# Patient Record
Sex: Male | Born: 1960
Health system: Southern US, Community
[De-identification: ages and names within clinical notes are randomized; demographics above are authoritative.]

## PROBLEM LIST (undated history)

## (undated) DIAGNOSIS — M545 Other chronic pain: Secondary | ICD-10-CM

## (undated) DIAGNOSIS — R0600 Dyspnea, unspecified: Secondary | ICD-10-CM

## (undated) DIAGNOSIS — M1990 Unspecified osteoarthritis, unspecified site: Secondary | ICD-10-CM

## (undated) DIAGNOSIS — R7611 Nonspecific reaction to tuberculin skin test without active tuberculosis: Secondary | ICD-10-CM

## (undated) DIAGNOSIS — I2510 Atherosclerotic heart disease of native coronary artery without angina pectoris: Secondary | ICD-10-CM

## (undated) DIAGNOSIS — I472 Ventricular tachycardia: Secondary | ICD-10-CM

## (undated) DIAGNOSIS — Z8679 Personal history of other diseases of the circulatory system: Secondary | ICD-10-CM

## (undated) DIAGNOSIS — E785 Hyperlipidemia, unspecified: Secondary | ICD-10-CM

## (undated) DIAGNOSIS — R9389 Abnormal findings on diagnostic imaging of other specified body structures: Secondary | ICD-10-CM

## (undated) DIAGNOSIS — J449 Chronic obstructive pulmonary disease, unspecified: Secondary | ICD-10-CM

## (undated) DIAGNOSIS — I00 Rheumatic fever without heart involvement: Secondary | ICD-10-CM

## (undated) DIAGNOSIS — Z860100 Personal history of colon polyps, unspecified: Secondary | ICD-10-CM

## (undated) DIAGNOSIS — F17200 Nicotine dependence, unspecified, uncomplicated: Secondary | ICD-10-CM

## (undated) DIAGNOSIS — I4729 Other ventricular tachycardia: Secondary | ICD-10-CM

## (undated) DIAGNOSIS — J301 Allergic rhinitis due to pollen: Secondary | ICD-10-CM

## (undated) DIAGNOSIS — I209 Angina pectoris, unspecified: Secondary | ICD-10-CM

## (undated) DIAGNOSIS — I639 Cerebral infarction, unspecified: Secondary | ICD-10-CM

## (undated) DIAGNOSIS — I10 Essential (primary) hypertension: Secondary | ICD-10-CM

## (undated) DIAGNOSIS — S31119A Laceration without foreign body of abdominal wall, unspecified quadrant without penetration into peritoneal cavity, initial encounter: Secondary | ICD-10-CM

## (undated) DIAGNOSIS — Z8719 Personal history of other diseases of the digestive system: Secondary | ICD-10-CM

## (undated) DIAGNOSIS — K219 Gastro-esophageal reflux disease without esophagitis: Secondary | ICD-10-CM

## (undated) DIAGNOSIS — R011 Cardiac murmur, unspecified: Secondary | ICD-10-CM

## (undated) DIAGNOSIS — K819 Cholecystitis, unspecified: Secondary | ICD-10-CM

## (undated) DIAGNOSIS — G8929 Other chronic pain: Secondary | ICD-10-CM

## (undated) HISTORY — DX: Personal history of colon polyps, unspecified: Z86.0100

## (undated) HISTORY — DX: Other chronic pain: M54.50

## (undated) HISTORY — DX: Chronic obstructive pulmonary disease, unspecified: J44.9

## (undated) HISTORY — DX: Allergic rhinitis due to pollen: J30.1

## (undated) HISTORY — DX: Essential (primary) hypertension: I10

## (undated) HISTORY — DX: Hyperlipidemia, unspecified: E78.5

## (undated) HISTORY — PX: FOOT SURGERY: SHX648

## (undated) HISTORY — DX: Laceration without foreign body of abdominal wall, unspecified quadrant without penetration into peritoneal cavity, initial encounter: S31.119A

## (undated) HISTORY — DX: Other chronic pain: G89.29

## (undated) HISTORY — DX: Rheumatic fever without heart involvement: I00

## (undated) HISTORY — DX: Other ventricular tachycardia: I47.29

## (undated) HISTORY — DX: Nonspecific reaction to tuberculin skin test without active tuberculosis: R76.11

## (undated) HISTORY — PX: VASECTOMY: SHX75

## (undated) HISTORY — DX: Ventricular tachycardia: I47.2

## (undated) HISTORY — DX: Cardiac murmur, unspecified: R01.1

## (undated) HISTORY — DX: Personal history of other diseases of the circulatory system: Z86.79

## (undated) HISTORY — DX: Personal history of colonic polyps: Z86.010

## (undated) HISTORY — DX: Dyspnea, unspecified: R06.00

## (undated) HISTORY — DX: Cerebral infarction, unspecified: I63.9

## (undated) HISTORY — DX: Low back pain: M54.5

## (undated) HISTORY — DX: Abnormal findings on diagnostic imaging of other specified body structures: R93.89

## (undated) HISTORY — PX: TONSILLECTOMY: SUR1361

---

## 2007-04-21 ENCOUNTER — Emergency Department (HOSPITAL_COMMUNITY): Admission: EM | Admit: 2007-04-21 | Discharge: 2007-04-21 | Payer: Self-pay | Admitting: Emergency Medicine

## 2008-05-22 ENCOUNTER — Emergency Department (HOSPITAL_COMMUNITY): Admission: EM | Admit: 2008-05-22 | Discharge: 2008-05-22 | Payer: Self-pay | Admitting: Emergency Medicine

## 2009-09-10 ENCOUNTER — Emergency Department (HOSPITAL_COMMUNITY): Admission: EM | Admit: 2009-09-10 | Discharge: 2009-09-10 | Payer: Self-pay | Admitting: Emergency Medicine

## 2009-10-12 ENCOUNTER — Ambulatory Visit: Payer: Self-pay | Admitting: Physician Assistant

## 2009-10-12 DIAGNOSIS — R0602 Shortness of breath: Secondary | ICD-10-CM

## 2009-10-12 DIAGNOSIS — K5900 Constipation, unspecified: Secondary | ICD-10-CM | POA: Insufficient documentation

## 2009-10-12 DIAGNOSIS — F17200 Nicotine dependence, unspecified, uncomplicated: Secondary | ICD-10-CM

## 2009-10-12 DIAGNOSIS — F1021 Alcohol dependence, in remission: Secondary | ICD-10-CM

## 2009-10-12 LAB — CONVERTED CEMR LAB
AST: 15 units/L (ref 0–37)
BUN: 15 mg/dL (ref 6–23)
Basophils Absolute: 0 10*3/uL (ref 0.0–0.1)
Basophils Relative: 0 % (ref 0–1)
Calcium: 9.7 mg/dL (ref 8.4–10.5)
Cocaine Metabolites: NEGATIVE
Creatinine,U: 101.5 mg/dL
Glucose, Bld: 98 mg/dL (ref 70–99)
HCT: 47.4 % (ref 39.0–52.0)
Hemoglobin: 15.7 g/dL (ref 13.0–17.0)
MCHC: 33.1 g/dL (ref 30.0–36.0)
Methadone: NEGATIVE
Monocytes Absolute: 0.5 10*3/uL (ref 0.1–1.0)
Monocytes Relative: 5 % (ref 3–12)
Neutro Abs: 6.2 10*3/uL (ref 1.7–7.7)
Opiate Screen, Urine: NEGATIVE
Phencyclidine (PCP): NEGATIVE
Platelets: 181 10*3/uL (ref 150–400)
Propoxyphene: NEGATIVE
RBC: 5.12 M/uL (ref 4.22–5.81)
TSH: 0.595 microintl units/mL (ref 0.350–4.500)
Total Bilirubin: 0.3 mg/dL (ref 0.3–1.2)
WBC: 10.4 10*3/uL (ref 4.0–10.5)

## 2009-10-13 ENCOUNTER — Encounter: Payer: Self-pay | Admitting: Physician Assistant

## 2009-10-15 ENCOUNTER — Telehealth: Payer: Self-pay | Admitting: Physician Assistant

## 2009-10-15 ENCOUNTER — Ambulatory Visit (HOSPITAL_COMMUNITY): Admission: RE | Admit: 2009-10-15 | Discharge: 2009-10-15 | Payer: Self-pay | Admitting: Internal Medicine

## 2009-10-20 ENCOUNTER — Ambulatory Visit: Payer: Self-pay | Admitting: Physician Assistant

## 2009-10-20 LAB — CONVERTED CEMR LAB
Cholesterol: 162 mg/dL (ref 0–200)
Triglycerides: 132 mg/dL (ref <150)

## 2009-10-21 ENCOUNTER — Ambulatory Visit (HOSPITAL_COMMUNITY): Admission: RE | Admit: 2009-10-21 | Discharge: 2009-10-21 | Payer: Self-pay | Admitting: Internal Medicine

## 2009-10-21 ENCOUNTER — Encounter: Payer: Self-pay | Admitting: Physician Assistant

## 2009-11-04 ENCOUNTER — Encounter: Payer: Self-pay | Admitting: Physician Assistant

## 2009-11-07 ENCOUNTER — Encounter: Payer: Self-pay | Admitting: Physician Assistant

## 2009-11-15 ENCOUNTER — Ambulatory Visit: Payer: Self-pay | Admitting: Physician Assistant

## 2009-11-15 DIAGNOSIS — J449 Chronic obstructive pulmonary disease, unspecified: Secondary | ICD-10-CM

## 2010-01-05 ENCOUNTER — Ambulatory Visit: Payer: Self-pay | Admitting: Physician Assistant

## 2010-01-05 DIAGNOSIS — J302 Other seasonal allergic rhinitis: Secondary | ICD-10-CM

## 2010-01-05 DIAGNOSIS — M79609 Pain in unspecified limb: Secondary | ICD-10-CM

## 2010-01-10 ENCOUNTER — Telehealth: Payer: Self-pay | Admitting: Physician Assistant

## 2010-01-11 ENCOUNTER — Encounter (INDEPENDENT_AMBULATORY_CARE_PROVIDER_SITE_OTHER): Payer: Self-pay | Admitting: Internal Medicine

## 2010-01-11 ENCOUNTER — Encounter: Payer: Self-pay | Admitting: Physician Assistant

## 2010-01-11 ENCOUNTER — Ambulatory Visit (HOSPITAL_COMMUNITY): Admission: RE | Admit: 2010-01-11 | Discharge: 2010-01-11 | Payer: Self-pay | Admitting: Internal Medicine

## 2010-01-12 ENCOUNTER — Encounter: Payer: Self-pay | Admitting: Physician Assistant

## 2010-01-16 ENCOUNTER — Encounter: Payer: Self-pay | Admitting: Physician Assistant

## 2010-01-31 ENCOUNTER — Encounter (HOSPITAL_COMMUNITY): Admission: RE | Admit: 2010-01-31 | Discharge: 2010-03-23 | Payer: Self-pay | Admitting: Cardiology

## 2010-02-02 ENCOUNTER — Emergency Department (HOSPITAL_COMMUNITY): Admission: EM | Admit: 2010-02-02 | Discharge: 2010-02-02 | Payer: Self-pay | Admitting: Emergency Medicine

## 2010-03-07 ENCOUNTER — Encounter: Payer: Self-pay | Admitting: Physician Assistant

## 2010-03-07 ENCOUNTER — Telehealth: Payer: Self-pay | Admitting: Physician Assistant

## 2010-03-07 ENCOUNTER — Ambulatory Visit: Payer: Self-pay | Admitting: Nurse Practitioner

## 2010-03-07 DIAGNOSIS — M545 Low back pain: Secondary | ICD-10-CM

## 2010-03-15 ENCOUNTER — Telehealth: Payer: Self-pay | Admitting: Physician Assistant

## 2010-03-16 ENCOUNTER — Encounter (INDEPENDENT_AMBULATORY_CARE_PROVIDER_SITE_OTHER): Payer: Self-pay | Admitting: Internal Medicine

## 2010-03-16 ENCOUNTER — Ambulatory Visit (HOSPITAL_COMMUNITY): Admission: RE | Admit: 2010-03-16 | Discharge: 2010-03-16 | Payer: Self-pay | Admitting: Internal Medicine

## 2010-03-16 ENCOUNTER — Encounter: Payer: Self-pay | Admitting: Physician Assistant

## 2010-03-16 ENCOUNTER — Ambulatory Visit: Payer: Self-pay | Admitting: Vascular Surgery

## 2010-03-24 ENCOUNTER — Emergency Department (HOSPITAL_COMMUNITY): Admission: EM | Admit: 2010-03-24 | Discharge: 2010-03-24 | Payer: Self-pay | Admitting: Emergency Medicine

## 2010-04-07 ENCOUNTER — Ambulatory Visit: Payer: Self-pay | Admitting: Physician Assistant

## 2010-05-02 ENCOUNTER — Encounter: Payer: Self-pay | Admitting: Physician Assistant

## 2010-05-04 ENCOUNTER — Telehealth: Payer: Self-pay | Admitting: Physician Assistant

## 2010-05-06 ENCOUNTER — Telehealth: Payer: Self-pay | Admitting: Physician Assistant

## 2010-05-11 ENCOUNTER — Telehealth: Payer: Self-pay | Admitting: Physician Assistant

## 2010-05-11 ENCOUNTER — Telehealth (INDEPENDENT_AMBULATORY_CARE_PROVIDER_SITE_OTHER): Payer: Self-pay | Admitting: *Deleted

## 2010-05-18 ENCOUNTER — Ambulatory Visit: Payer: Self-pay | Admitting: Internal Medicine

## 2010-05-18 DIAGNOSIS — J392 Other diseases of pharynx: Secondary | ICD-10-CM

## 2010-05-18 DIAGNOSIS — R930 Abnormal findings on diagnostic imaging of skull and head, not elsewhere classified: Secondary | ICD-10-CM

## 2010-05-20 ENCOUNTER — Ambulatory Visit: Payer: Self-pay | Admitting: Cardiovascular Disease

## 2010-05-23 ENCOUNTER — Encounter: Payer: Self-pay | Admitting: Internal Medicine

## 2010-05-23 ENCOUNTER — Telehealth (INDEPENDENT_AMBULATORY_CARE_PROVIDER_SITE_OTHER): Payer: Self-pay | Admitting: *Deleted

## 2010-05-26 ENCOUNTER — Encounter: Admission: RE | Admit: 2010-05-26 | Discharge: 2010-06-21 | Payer: Self-pay | Admitting: Physician Assistant

## 2010-05-26 ENCOUNTER — Emergency Department (HOSPITAL_COMMUNITY): Admission: EM | Admit: 2010-05-26 | Discharge: 2010-05-26 | Payer: Self-pay | Admitting: Family Medicine

## 2010-06-02 ENCOUNTER — Encounter: Payer: Self-pay | Admitting: Physician Assistant

## 2010-06-07 ENCOUNTER — Telehealth: Payer: Self-pay | Admitting: Physician Assistant

## 2010-06-07 ENCOUNTER — Ambulatory Visit: Payer: Self-pay | Admitting: Physician Assistant

## 2010-06-07 DIAGNOSIS — K219 Gastro-esophageal reflux disease without esophagitis: Secondary | ICD-10-CM

## 2010-06-07 DIAGNOSIS — F329 Major depressive disorder, single episode, unspecified: Secondary | ICD-10-CM

## 2010-06-07 DIAGNOSIS — L0291 Cutaneous abscess, unspecified: Secondary | ICD-10-CM

## 2010-06-07 DIAGNOSIS — N401 Enlarged prostate with lower urinary tract symptoms: Secondary | ICD-10-CM | POA: Insufficient documentation

## 2010-06-07 DIAGNOSIS — L0390 Cellulitis, unspecified: Secondary | ICD-10-CM

## 2010-06-07 LAB — CONVERTED CEMR LAB
Basophils Absolute: 0 10*3/uL (ref 0.0–0.1)
Hemoglobin: 15.3 g/dL (ref 13.0–17.0)
MCHC: 33.4 g/dL (ref 30.0–36.0)
MCV: 95 fL (ref 78.0–100.0)
Neutrophils Relative %: 57 % (ref 43–77)
Platelets: 163 10*3/uL (ref 150–400)
RBC: 4.82 M/uL (ref 4.22–5.81)
RDW: 15.4 % (ref 11.5–15.5)

## 2010-06-08 ENCOUNTER — Encounter: Payer: Self-pay | Admitting: Physician Assistant

## 2010-06-21 ENCOUNTER — Encounter: Payer: Self-pay | Admitting: Physician Assistant

## 2010-06-22 ENCOUNTER — Ambulatory Visit (HOSPITAL_COMMUNITY): Admission: RE | Admit: 2010-06-22 | Discharge: 2010-06-22 | Payer: Self-pay | Admitting: Internal Medicine

## 2010-06-22 ENCOUNTER — Encounter: Payer: Self-pay | Admitting: Internal Medicine

## 2010-06-26 ENCOUNTER — Encounter: Payer: Self-pay | Admitting: Physician Assistant

## 2010-06-28 ENCOUNTER — Ambulatory Visit: Payer: Self-pay | Admitting: Physician Assistant

## 2010-06-29 ENCOUNTER — Encounter: Payer: Self-pay | Admitting: Physician Assistant

## 2010-07-08 ENCOUNTER — Telehealth: Payer: Self-pay | Admitting: Internal Medicine

## 2010-07-11 ENCOUNTER — Encounter (INDEPENDENT_AMBULATORY_CARE_PROVIDER_SITE_OTHER): Payer: Self-pay | Admitting: Internal Medicine

## 2010-07-27 ENCOUNTER — Telehealth: Payer: Self-pay | Admitting: Physician Assistant

## 2010-08-03 ENCOUNTER — Ambulatory Visit (HOSPITAL_COMMUNITY): Admission: RE | Admit: 2010-08-03 | Discharge: 2010-08-03 | Payer: Self-pay | Admitting: Gastroenterology

## 2010-08-08 ENCOUNTER — Telehealth: Payer: Self-pay | Admitting: Internal Medicine

## 2010-08-08 ENCOUNTER — Ambulatory Visit: Payer: Self-pay | Admitting: Physician Assistant

## 2010-08-08 DIAGNOSIS — R269 Unspecified abnormalities of gait and mobility: Secondary | ICD-10-CM

## 2010-08-08 DIAGNOSIS — Z860100 Personal history of colon polyps, unspecified: Secondary | ICD-10-CM | POA: Insufficient documentation

## 2010-08-12 ENCOUNTER — Encounter (INDEPENDENT_AMBULATORY_CARE_PROVIDER_SITE_OTHER): Payer: Self-pay | Admitting: Internal Medicine

## 2010-08-12 ENCOUNTER — Ambulatory Visit: Payer: Self-pay | Admitting: Vascular Surgery

## 2010-08-12 ENCOUNTER — Ambulatory Visit (HOSPITAL_COMMUNITY): Admission: RE | Admit: 2010-08-12 | Discharge: 2010-08-12 | Payer: Self-pay | Admitting: Internal Medicine

## 2010-08-12 ENCOUNTER — Telehealth (INDEPENDENT_AMBULATORY_CARE_PROVIDER_SITE_OTHER): Payer: Self-pay | Admitting: *Deleted

## 2010-08-15 ENCOUNTER — Encounter: Payer: Self-pay | Admitting: Physician Assistant

## 2010-08-16 ENCOUNTER — Ambulatory Visit (HOSPITAL_COMMUNITY): Admission: RE | Admit: 2010-08-16 | Discharge: 2010-08-16 | Payer: Self-pay | Admitting: Internal Medicine

## 2010-08-16 ENCOUNTER — Encounter: Payer: Self-pay | Admitting: Internal Medicine

## 2010-08-17 ENCOUNTER — Telehealth: Payer: Self-pay | Admitting: Physician Assistant

## 2010-08-19 ENCOUNTER — Encounter: Payer: Self-pay | Admitting: Physician Assistant

## 2010-08-19 ENCOUNTER — Emergency Department (HOSPITAL_COMMUNITY): Admission: EM | Admit: 2010-08-19 | Discharge: 2010-08-19 | Payer: Self-pay | Admitting: Emergency Medicine

## 2010-08-19 DIAGNOSIS — K5730 Diverticulosis of large intestine without perforation or abscess without bleeding: Secondary | ICD-10-CM | POA: Insufficient documentation

## 2010-08-19 DIAGNOSIS — A048 Other specified bacterial intestinal infections: Secondary | ICD-10-CM | POA: Insufficient documentation

## 2010-08-21 ENCOUNTER — Ambulatory Visit: Payer: Self-pay | Admitting: Internal Medicine

## 2010-08-23 ENCOUNTER — Ambulatory Visit: Payer: Self-pay | Admitting: Internal Medicine

## 2010-09-01 ENCOUNTER — Telehealth: Payer: Self-pay | Admitting: Physician Assistant

## 2010-09-19 ENCOUNTER — Ambulatory Visit: Payer: Self-pay | Admitting: Physician Assistant

## 2010-11-18 ENCOUNTER — Encounter: Payer: Self-pay | Admitting: Physician Assistant

## 2010-11-21 ENCOUNTER — Telehealth (INDEPENDENT_AMBULATORY_CARE_PROVIDER_SITE_OTHER): Payer: Self-pay | Admitting: *Deleted

## 2011-01-11 DIAGNOSIS — I2510 Atherosclerotic heart disease of native coronary artery without angina pectoris: Secondary | ICD-10-CM

## 2011-01-11 HISTORY — DX: Atherosclerotic heart disease of native coronary artery without angina pectoris: I25.10

## 2011-01-11 HISTORY — PX: PERCUTANEOUS CORONARY STENT INTERVENTION (PCI-S): SHX6016

## 2011-01-14 IMAGING — CT CT CHEST W/O CM
1 of 5 series · 4 of 36 positions shown, 5 images · non-contrast
Comparison: Chest x-ray 03/24/2010

CLINICAL DATA: Shortness of breath.  Abnormal chest x-ray.

CT CHEST WITHOUT CONTRAST
TECHNIQUE: Multidetector CT imaging of the chest was performed
following the standard protocol without IV contrast.

[Series 602: <mpr thick range> · coronal · 0.65mm/px · 4 of 146 slices shown, 5 images]
[im 30/146  mediastinal]
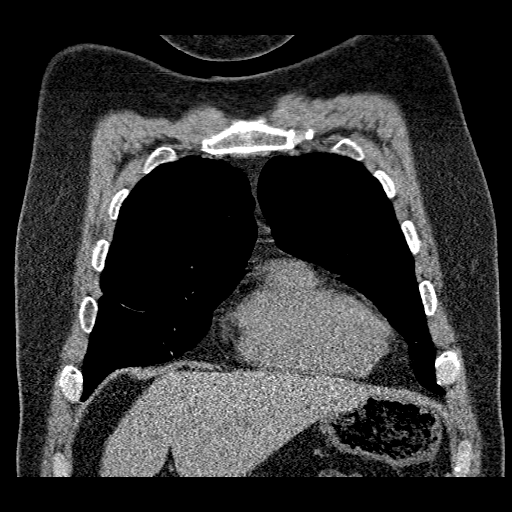
[im 30/146  lung]
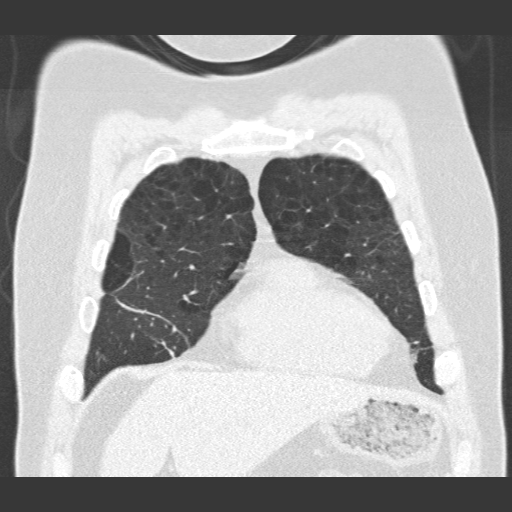
[im 59/146  lung]
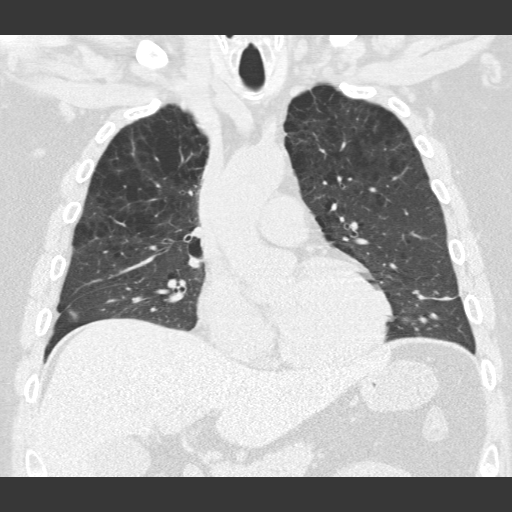
[im 88/146  lung]
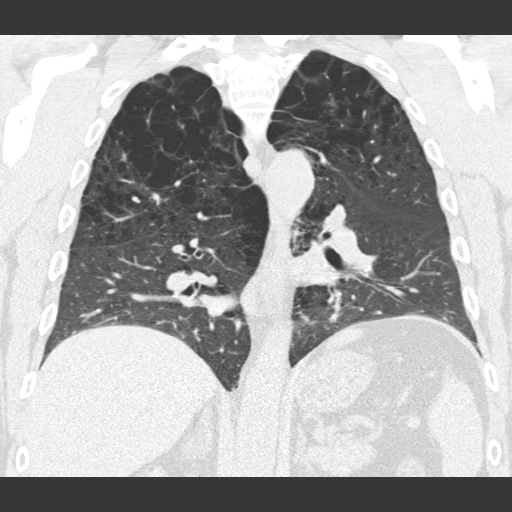
[im 117/146  lung]
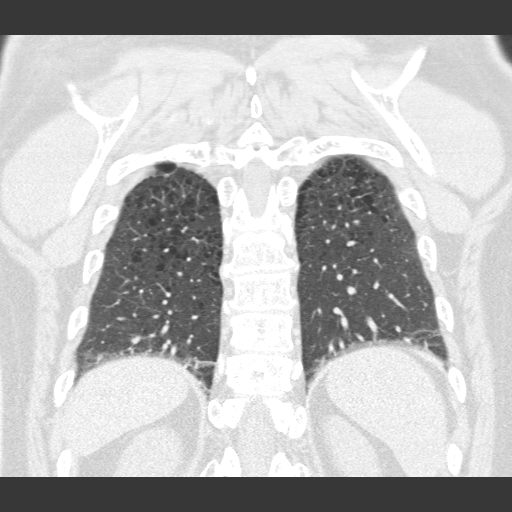

[4 of 36 positions shown; findings below may reference images not displayed]

FINDINGS: Mediastinal lymph nodes are not enlarged by CT size
criteria.  Hilar regions are difficult to definitively evaluate
without IV contrast.  No axillary adenopathy.  A focal
calcification is seen at the bifurcation of the left anterior
descending and left circumflex coronary arteries.  Heart size
normal.  No pericardial effusion.  There are small lymph nodes
along the course of the esophagus.

Centrilobular emphysema, with bullous changes at the apices.
Scarring is seen in the lung bases.  No pleural fluid.  Airway is
unremarkable. High resolution imaging shows no evidence of
superimposed interstitial lung disease or pulmonary fibrosis.
Inspiratory and expiratory imaging shows no air trapping.

Incidental imaging of the upper abdomen shows no acute findings.
No worrisome lytic or sclerotic lesions.
IMPRESSION: 1.  Emphysema with scattered bibasilar scarring. No evidence of
superimposed interstitial lung disease or pulmonary fibrosis.
2.  Focal atherosclerotic calcification near the bifurcation of the
left anterior descending and left circumflex coronary arteries.

## 2011-01-17 NOTE — Letter (Signed)
Summary: REFERRAL/PULMONARY//APPT DATE & TIME  REFERRAL/PULMONARY//APPT DATE & TIME   Imported By: Arta Bruce 06/27/2010 10:11:40  _____________________________________________________________________  External Attachment:    Type:   Image     Comment:   External Document

## 2011-01-17 NOTE — Progress Notes (Signed)
  Phone Note From Other Clinic   Summary of Call: pt has referral for GI and PT... pt just got card renewed recently :) Initial call taken by: Armenia Shannon,  May 11, 2010 10:57 AM
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
## 2011-01-17 NOTE — Progress Notes (Signed)
Summary: cpst need to redo  Phone Note Outgoing Call   Summary of Call: pls cal patient. THE CPST test did not coome out well. He did not work hard enough. So we might have to repeat it. Please tell him to work harder. I am in process of trying to get this redone. No extra cost Initial call taken by: Kalman Shan MD,  July 08, 2010 6:55 PM  Follow-up for Phone Call        Called and informed the patient about the cpst. Pt states he worked as hard as he could, so he doesn;t understand how he didn't work hard enough. Pt states he is really sore still from doing the test. Pt states he is okay with re doing the test if need be. Pt would also like to know if MR still wants hime ti come in on july 27 for his apt he has scheduled with him? Carver Fila  July 11, 2010 1:15 PM   Additional Follow-up for Phone Call Additional follow up Details #1::        called an informed pt per Latessa Tillis no need to come in for scheduled apt for july 27,2011. pt states he verbally understood. Pt would like to know if he can start taking advair diskus bc pt states he is haivg acid reflux Carver Fila  July 11, 2010 4:09 PM   spoke with MR and told him the pt called back since he had not heard from him--MR states he will call pt this afternoon to discuss msg Additional Follow-up by: Philipp Deputy CMA,  July 21, 2010 1:32 PM    Additional Follow-up for Phone Call Additional follow up Details #2::    spoke to patient. Depending on ct result I wanted to start spiriva. His CT shows emphysema. So he can come and get samples (give 2-3) of SPIRIVA one puff daily Told him not to take advair.  Also, c/o "seizure like activity" with cough.   Also, reschedule CPST with abg pre and abg at peak exercise. Test at no extra cost due to poor test performance prior to test.   REturn to office after repeat CPST  If cough is bothersome go to ER  Follow-up by: Kalman Shan MD,  July 21, 2010 4:22 PM  Additional Follow-up  for Phone Call Additional follow up Details #3:: Details for Additional Follow-up Action Taken: Spoke with pt and notified [facility] will cntact to resched CPST at no cost.  Spiriva samples left up front for pt.  Pt verbalized understanding. Additional Follow-up by: Vernie Murders,  July 21, 2010 4:40 PM

## 2011-01-17 NOTE — Letter (Signed)
Summary: CPST [facility] Pul[facility]  [address]   [place], [place] [address]   Phone: [phone]  Fax: [phone]     Patient's Name: John Forbes Date of Birth: 04/14/1961 MRN: [id number]  CPST  Choose test method and choice  a)__x_Bike - recommended by ATS/ACCP. Do at [facility] at [facility]  b)___Treadmill - less preferred. Do at [facility] at [facility] or do at [facility] P[facility] lab  Choose one or more indication for test  INDICATIONS FOR CARDIOPULMONARY EXERCISE TESTING Evaluation of exercise tolerance ______ Determination of functional impairment or capacity (peak V? O2) ______ Determination of exercise-limiting factors and pathophysiologic mechanisms  Evaluation of undiagnosed exercise intolerance __x___ Assessing contribution of cardiac and pulmonary etiology in coexisting disease __x___ Symptoms disproportionate to resting pulmonary and cardiac tests  __x___Unexplained dyspnea when initial cardiopulmonary testing is nondiagnostic  Evaluation of patients with cardiovascular disease _____ Functional evaluation and prognosis in patients with heart failure _____ Selection for cardiac transplantation _____ Exercise prescription and monitoring response to exercise training for cardiac rehabilitation (special circumstances; i.e., pacemakers)  Evaluation of patients with respiratory disease _____ Functional impairment assessment (see specific clinical applications)  _x____Chronic obstructive pulmonary disease Establishing exercise limitation(s) and assessing other potential contributing factors, especially occult heart disease (ischemia) ______Determination of magnitude of hypoxemia and for O2 prescription When objective determination of therapeutic intervention is necessary and not adequately addressed by standard pulmonary function testing  _____ Interstitial lung diseases _____Detection of early (occult) gas exchange abnormalities _____Overall  assessment/monitoring of pulmonary gas exchange _____Determination of magnitude of hypoxemia and for O2 prescription _____Determination of potential exercise-limiting factors _____Documentation of therapeutic response to potentially toxic therapy  ____ Pulmonary vascular disease (careful risk-benefit analysis required)  ____ Cystic fibrosis  ____ Exercise-induced bronchospasm  Specific clinical applications ____  Preoperative evaluation _____Lung resectional surgery _____Elderly patients undergoing major abdominal surgery _____Lung volume resectional surgery for emphysema (currently investigational)  ____ Exercise evaluation and prescription for pulmonary rehabilitation  _x___ Evaluation for impairment-disability  ____ Evaluation for lung, heart-lung transplantation ____ Definition of abbreviation: V? O2______ -oxygen consumption.    Kalman Shan MD    [facility]

## 2011-01-17 NOTE — Miscellaneous (Signed)
Summary: Rehab Report/DISCHARGE SUMMARY  Rehab Report/DISCHARGE SUMMARY   Imported By: Arta Bruce 06/29/2010 11:50:11  _____________________________________________________________________  External Attachment:    Type:   Image     Comment:   External Document

## 2011-01-17 NOTE — Progress Notes (Signed)
Summary: PT postponed by patient due to transportation issues.  Phone Note Outgoing Call   Summary of Call: F.Y.I  CRYSTAL FROM OT TOLD ME THAT MR Lutterman  HAVEN'T ISSUES WITH GAS MONEY HE WANT TO PUT IT IN HOLD HIS APPT  FOR NEXT MONTH AND HE IS GOING TO APPLY FOR MEDICARE . Initial call taken by: Cheryll Dessert,  September 01, 2010 9:35 AM
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
## 2011-01-17 NOTE — Progress Notes (Signed)
Summary: GI referrral update  Phone Note From Other Clinic   Caller: Referral Coordinator Summary of Call: Pt is sched for Colon & Endo with Dr.[place] on 08-03-10 Initial call taken by: Candi Leash,  July 27, 2010 8:22 AM

## 2011-01-17 NOTE — Letter (Signed)
Summary: *HSN Results Follow up  [facility]  [address] [place], [place] [address]   Phone: [phone]  Fax: [phone]      06/08/2010   [name], [name]   Dear  Mr. John Forbes,                            ____S.Drinkard,FNP   ____D. Gore,FNP       ____B. McPherson,MD   ____V. Rankins,MD    ____E. Mulberry,MD    ____N. Daphine Deutscher, FNP  ____D. Reche Dixon, MD    ____K. Philipp Deputy, MD    __x__S. Alben Spittle, PA-C     This letter is to inform you that your recent test(s):  _______Pap Smear    ___x____Lab Test     _______X-ray    ___x____ is within acceptable limits  _______ requires a medication change  _______ requires a follow-up lab visit  _______ requires a follow-up visit with your provider   Comments:  Blood counts normal.  PSA (prostate test) normal.  Vitamin D level is normal.       _________________________________________________________ If you have any questions, please contact our office                     Sincerely,  John Newcomer PA-C [facility]

## 2011-01-17 NOTE — Letter (Signed)
Summary: CPST- R/O Contraindications  [facility]  [address]   [place], [place] [address]   Phone: [phone]  Fax: [phone]    Patient's Name: John Forbes Date of Birth: 01-09-1961 MRN: [id number]  *********Rule out Contraindications**************** Absolute                                                                                                                           ___ Acute MI (3-5 Days)                                 ___ Unstable Angina                                          ___ Uncontrolled arrhythmias causing symptoms or hemodynamic compromise. ___ Syncope                                                     ___ Active endocarditis                                         ___ Acute Myocarditis/Pericarditis                        ___ Symptomatic severe aortic stenosis  ___ Acute Pulmonary embolus or pulmonary infarction                ___ Uncontrolled Heart Failure  ___ Thrombosis of lower extremitie ___ Suspected dissecting aneurysm ___ Uncontrolled Asthma                          ___ Pulmonary Edema                                        ___ RA desat @ rest<85%                                      ___ Repiratory Failure                                         ___ Acute noncardiopulmonary disorder that may affect exercise performance or be         aggravated by exercise (ie infection, renal failure,  thyrotoxicosis) .                               ___ Mental impairment leading to inabliity to cooperate   Relative ___ Left main coronary stenosis or its equivalent ___ Moderate stentoic valvular heart disease ___ Severe untreated arterial hypertension @ rest (<200 mmHg             systolic,>119mmHg Diastolic ___ Tachy/Brady Arrhythmias ___ High- degree artioventricular block ___ Hypertrophic cardiomyopathy ___ Significant pulmonary hypertension ___ Advanced or complicated pregnancy ___ Electrolyte abnormalities ___ Orthopedic impairment  that compromises exercise performance  NO CONTRAINDICATION. LEXISCANMYOVIEW negative but does have coronary calcification. No chestpain  Kalman Shan MD    [facility]

## 2011-01-17 NOTE — Letter (Signed)
Summary: REQUESTING RECORS FROM [facility]  REQUESTING RECORS FROM [facility]   Imported By: Arta Bruce 12/31/2009 11:31:37  _____________________________________________________________________  External Attachment:    Type:   Image     Comment:   External Document

## 2011-01-17 NOTE — Progress Notes (Signed)
Summary: ct results  Phone Note Call from Patient   Caller: Patient Call For: John Forbes Reason for Call: Lab or Test Results Summary of Call: Wants results of ct. Initial call taken by: Darletta Moll,  May 23, 2010 10:37 AM  Follow-up for Phone Call        Results unsigned in EMR-will forward message to MR.  Pls advise.  Thanks! Gweneth Dimitri RN  May 23, 2010 11:01 AM   Additional Follow-up for Phone Call Additional follow up Details #1::        see results note. he neeeds cpst. I have ordered it. Please give fu appt after CPST is done Additional Follow-up by: Kalman Shan MD,  May 23, 2010 5:52 PM    Additional Follow-up for Phone Call Additional follow up Details #2::    Spoke with pt and advised of CT results per append and the need for CPST. Pt states he has not taken adviar sine appt on 05-18-10, bu the still has that feeling in his throat like someone is strangling him and wants to know what MR recs for this. Please advise. Carron Curie CMA  May 24, 2010 9:50 AM   Additional Follow-up for Phone Call Additional follow up Details #3:: Details for Additional Follow-up Action Taken: He had PFTs in Nov 2010. Can you get hold of that actual trace  please. He should still get his CPST and then come to see me. Then, we will decide. IF it gets worse in interim, he can call back and we can get him to see ENT  1st available CPST is June 23, 2010 at 11:30 at [facility]. Pt is aware of appt. and f/u appt scheduled 07/13/10 at 1:30 to see Dr. Marchelle Gearing. Rhonda Cobb  May 24, 2010 10:21 AM per Bjorn Loser pt is aware of both appts will await call from pt if he has further trouble if doing ok will just keep reg. f/u   Philipp Deputy [facility]  May 24, 2010 1:36 PM  Additional Follow-up by: Kalman Shan MD,  May 24, 2010 9:58 AM
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
## 2011-01-17 NOTE — Letter (Signed)
Summary: TEST ORDER FORM/2 D ECHO//APPT DATE & TIME  TEST ORDER FORM/2 D ECHO//APPT DATE & TIME   Imported By: Arta Bruce 02/15/2010 12:46:59  _____________________________________________________________________  External Attachment:    Type:   Image     Comment:   External Document
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
## 2011-01-17 NOTE — Progress Notes (Signed)
Summary: GI referral  Phone Note Outgoing Call   Summary of Call: Please refer to Dr. Doreatha Martin for [facility] +/- EGD (patient with reflux symptoms).  Has Fhx of colon cancer and needs screening colo. Initial call taken by: Brynda Rim,  June 07, 2010 5:42 PM

## 2011-01-17 NOTE — Progress Notes (Signed)
Summary: GI and PT referral  Phone Note Call from Patient Call back at [facility] Phone [phone] Call back at [phone]   Summary of Call: The pt is requesting to be referral to do a colonoscopy and physical therapy.  Also, the pt wants the provider write down a letter to his attorney Su Hilt)  that he is unable to work. Kamirah Shugrue PA-c Initial call taken by: Manon Hilding,  May 04, 2010 9:24 AM  Follow-up for Phone Call        He had appt for colonoscopy and has had referral put in for PT.  [facility], I think he may have been waiting on eligibility. Please refer to GI for screening colo and PT for back pain.  [place], His attorney will need to send me a request for what he needs.  Follow-up by: Tereso Newcomer PA-C,  May 06, 2010 1:39 PM     Appended Document: GI and PT referral [place], Please see phone note regarding the letter he needs. Tereso Newcomer PA-C  May 09, 2010 1:10 PM  which phone note.. [place] Shannon  May 10, 2010 4:22 PM  Phone note from 05/04/2010 Needs a letter to his attorney. Tereso Newcomer PA-C  May 10, 2010 4:49 PM   Left message on answering machine for pt to call back...Marland KitchenMarland KitchenMarland Kitchen  [place] Shannon  May 10, 2010 4:59 PM  pt says the letter is for his  disability lawyer... pt says it needs to state he can't work in his condition that he is in... [place] Shannon  May 11, 2010 10:55 AM   pt called to see if you have wrote the letter to his lawyer...Marland Kitchen. called lawyer office and they said they need a letter of support and they wants to know the limitations of the pt and if he can/ can not work.Huntley Dec from lawyers office says this letter is to see if they will take his case or not.. we can fax the letter to  [phone] [place]  [place], Letter is in his chart. Please send to patient or his lawyer. If my electronic signature does not print, let me sign it. Tereso Newcomer PA-C  June 26, 2010 10:45 PM  letter sent.Marland KitchenMarland KitchenArm[place] Shannon  June 29, 2010 3:40 PM    Phone Note Call from  Patient

## 2011-01-17 NOTE — Miscellaneous (Signed)
Summary: Rehab Report//INITIAL SUMMARY  Rehab Report//INITIAL SUMMARY   Imported By: Arta Bruce 06/02/2010 09:51:21  _____________________________________________________________________  External Attachment:    Type:   Image     Comment:   External Document

## 2011-01-17 NOTE — Progress Notes (Signed)
  Phone Note Other Incoming   Request: Send information Summary of Call: Received attorneys request from [employer], forwarded request to [employer].
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
## 2011-01-17 NOTE — Progress Notes (Signed)
Summary: cpst repeat will be charged  Phone Note Outgoing Call   Summary of Call: called patient - told him reopeat CPST will be charged. This is contradictory to what I ahd told him in past. However, I wil only charge once my part of interpretation. He is okay with it. Says he is on 'orange card' program. So, he will have test 8/30 and see me 9/6 Initial call taken by: Kalman Shan MD,  August 08, 2010 4:25 PM

## 2011-01-17 NOTE — Letter (Signed)
Summary: REQUESTING RECORDS FROM [facility]  REQUESTING RECORDS FROM [facility]   Imported By: Arta Bruce 12/30/2009 15:54:51  _____________________________________________________________________  External Attachment:    Type:   Image     Comment:   External Document

## 2011-01-17 NOTE — Letter (Signed)
Summary: LOWER EXTREMITY ARTERIAL EVAL//FINAL REPORT  LOWER EXTREMITY ARTERIAL EVAL//FINAL REPORT   Imported By: Arta Bruce 03/17/2010 10:10:06  _____________________________________________________________________  External Attachment:    Type:   Image     Comment:   External Document

## 2011-01-17 NOTE — Letter (Signed)
Summary: REQUESTED RECORDS FROM [facility]//SHREDDED AFTER 10 YRS.  REQUESTED RECORDS FROM [facility]//SHREDDED AFTER 10 YRS.   Imported By: Arta Bruce 03/09/2010 15:35:32  _____________________________________________________________________  External Attachment:    Type:   Image     Comment:   External Document

## 2011-01-17 NOTE — Letter (Signed)
Summary: REFERRAL/GASTROENTEROLOGY  REFERRAL/GASTROENTEROLOGY   Imported By: Arta Bruce 11/18/2010 12:58:23  _____________________________________________________________________  External Attachment:    Type:   Image     Comment:   External Document
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
## 2011-01-17 NOTE — Miscellaneous (Signed)
Summary: Records from [place]  No records rec'd from previous dr. in [place]. Records shredded after 10 years.   Clinical Lists Changes

## 2011-01-17 NOTE — Letter (Signed)
Summary: LAB ORDER   LAB ORDER   Imported By: Arta Bruce 10/14/2010 11:29:50  _____________________________________________________________________  External Attachment:    Type:   Image     Comment:   External Document

## 2011-01-17 NOTE — Miscellaneous (Signed)
  Clinical Lists Changes  Problems: Added new problem of DIVERTICULOSIS, COLON (ICD-562.10) - seen on colonoscopy 07/2010 Observations: Added new observation of PRIMARY MD: Tereso Newcomer PA-C (08/19/2010 13:34)

## 2011-01-17 NOTE — Progress Notes (Signed)
Summary: GI & PT referral updates  Phone Note From Other Clinic   Caller: Referral Coordinator Summary of Call: Pt had an appt @ [facility] on 03-23-10 with Dr.Hayes.Pt was aware of this.  [facility] called to offer the Pt an appt and Pt informed them that his card was expired. I called Pt today and gave him [facility] # to R/S and also informed him that he wil be charged a [employer]. I updated th PT referral & gave Pt the # to get an appt since they already had the 1st referral Initial call taken by: Candi Leash,  May 11, 2010 11:49 AM  Follow-up for Phone Call        [place], Please follow up with patient to see if he has made his appointments yet. Follow-up by: Tereso Newcomer PA-C,  May 22, 2010 1:45 PM  Additional Follow-up for Phone Call Additional follow up Details #1::        spoke with pt and he said PT called and has appt with them and has not heard from GI yet Additional Follow-up by: [place] Shannon,  May 23, 2010 9:44 AM

## 2011-01-17 NOTE — Letter (Signed)
Summary: RECEIVED HEALTH Dentist [facility]  RECEIVED HEALTH INSURANCE CLAIM [facility]   Imported By: Arta Bruce 03/15/2010 12:41:45  _____________________________________________________________________  External Attachment:    Type:   Image     Comment:   External Document

## 2011-01-17 NOTE — Letter (Signed)
Summary: REQUESTING RECORDS FROM [facility]  REQUESTING RECORDS FROM [place]   Imported By: Arta Bruce 12/31/2009 11:33:52  _____________________________________________________________________  External Attachment:    Type:   Image     Comment:   External Document

## 2011-01-17 NOTE — Letter (Signed)
Summary: Handout Printed  Printed Handout:  - Leg Cramps

## 2011-01-17 NOTE — Letter (Signed)
Summary: DIAGNOSIS:DYSPNEA  DIAGNOSIS:DYSPNEA   Imported By: Arta Bruce 02/01/2010 14:51:34  _____________________________________________________________________  External Attachment:    Type:   Image     Comment:   External Document

## 2011-01-17 NOTE — Progress Notes (Signed)
  Phone Note Outgoing Call   Summary of Call: Stanton Kidney, Patient needs referral to PT for low back pain.  Order in system.  Also, needs GI referral for screening colo.  Has a FHx of colon cancer and never had test done.  Order in system. Initial call taken by: Brynda Rim,  March 07, 2010 3:19 PM

## 2011-01-17 NOTE — Progress Notes (Signed)
Summary: WANTS COLONOSCOPY RESULTS  Phone Note Call from Patient Call back at Home Phone [phone]   Reason for Call: Lab or Test Results Summary of Call: John Forbes pt. mr John Forbes called to see if someone from her will call him and let him know what his results from his colonoscopy. Initial call taken by: Leodis Rains,  August 17, 2010 9:50 AM  Follow-up for Phone Call        He had a colon polyp.  He knew that.  I cannot find any pathology results.  Call [facility] to request path results. Tereso Newcomer PA-C  August 17, 2010 9:59 PM  Per medical records -- states they don't have a report yet -- listed as pending.   Dutch Quint RN  August 18, 2010 10:56 AM   Additional Follow-up for Phone Call Additional follow up Details #1::        Please notify patient and have copy of report sent to me when available. Tereso Newcomer PA-C  August 18, 2010 2:07 PM  I called [facility] and they said he hadn't had it done.  However, according to the operative report dated 08/03/10,  he had one done with his endoscopy.  Please see attachment of that date.  What can I tell the patient? Additional Follow-up by: Dutch Quint RN,  August 26, 2010 11:56 AM    Additional Follow-up for Phone Call Additional follow up Details #2::    Advised pt. of pending results.  Dutch Quint RN  August 23, 2010 9:22 AM  Spoke with [facility] who called [facility] [facility].  They do not have a specimen. So the polyp was removed.  But I misunderstood and thought a biopsy was pending.  It is not.   Dr. Blossom Hoops note indicates that he wants the patient to have a repeat in one year.   I suppose he did not think it needed biopsy.  I suggest we follow through with his plan to get another colo in one year to follow up. Tereso Newcomer PA-C  August 26, 2010 1:28 PM  Advised pt. of provider's response and recommendation for follow-up colonscopy in one year -- verbalized agreement.  Dutch Quint RN  August 29, 2010 10:30  AM
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
## 2011-01-17 NOTE — Progress Notes (Signed)
  Phone Note Outgoing Call   Summary of Call: Patient referred to card.   If they decide to do a stress test, please ask cardiologist to perform O2 sat check during ambulation with stress test and in recovery (tell them patient has significant COPD). Thanks Initial call taken by: Brynda Rim,  January 10, 2010 10:28 AM  Follow-up for Phone Call        Left message on answering machine for Dr. Norris Cross nurse  to call back.Marland KitchenMarland KitchenMarland KitchenArmenia Shannon  January 10, 2010 10:36 AM   Left message on answering machine for pt to call back.Marland KitchenMarland KitchenMarland KitchenArmenia Shannon  January 13, 2010 12:43 PM

## 2011-01-17 NOTE — Letter (Signed)
Summary: MAILED REQUESTED RECORDS TO [employer]  MAILED REQUESTED RECORDS TO [employer]   Imported By: Arta Bruce 11/18/2010 15:24:35  _____________________________________________________________________  External Attachment:    Type:   Image     Comment:   External Document
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
## 2011-01-17 NOTE — Progress Notes (Signed)
Summary: no show for vascular studies  Phone Note Outgoing Call   Summary of Call: Vascular lab called pt was a no show for his LE Arterial  doppler studies. Pt. called states his truck broke and had no way to get there. stressed importance of getting test done ASAP. His orange card expires 03/21/10. Encouraged to call and schedule appt. for Vascular lab and eligibility ASAP. He does not want to go for PT until after back x-rays. will forward to [facility] Initial call taken by: Gaylyn Cheers RN,  March 15, 2010 12:52 PM  Follow-up for Phone Call        Get the xray done. He can still go to PT. He needs to get ABIs done as well. Follow-up by: Tereso Newcomer PA-C,  March 15, 2010 4:48 PM  Additional Follow-up for Phone Call Additional follow up Details #1::        pt states he is not going to do PT until he has xrays of his neck completed. He is headed to [facility] to vascular lab this morning and is going to get xrays as well per pt. Did advise I would let Lorin Picket know what his plans are and when those results come in we will contact as needed. Additional Follow-up by: Mikey College CMA,  March 16, 2010 9:49 AM    Additional Follow-up for Phone Call Additional follow up Details #2::    ok Follow-up by: Tereso Newcomer PA-C,  March 16, 2010 11:07 AM

## 2011-01-17 NOTE — Progress Notes (Signed)
  Phone Note Outgoing Call   Summary of Call: Confirm that patient knows that he sees the pulmonologist on 6.1.2011 at 1:30 (arrive at 1:15).  Initial call taken by: Brynda Rim,  May 06, 2010 4:34 PM  Follow-up for Phone Call        pt is aware of appt...  Follow-up by: Arm[place] Shannon,  May 11, 2010 10:52 AM

## 2011-01-17 NOTE — Letter (Signed)
Summary: Generic Letter  [facility]  [address] [place], [place] [address]   Phone: [phone]  Fax: [phone]    06/26/2010  To whom it may concern:  John Forbes (DOB 1961-07-22)has been a patient of mine since 09/2009.  He came to me with a history of breathing problems and reports having been exposed to ammonia gas in [place] in the mid 1990s.  I have no prior records available for my review.  He has undergone an initial evaluation by me.  It seems that he likely has COPD/emphysema related to a long history of ongoing cigarette smoking in addition to possible exposure to ammonia gas.  He has been referred to a pulmonologist, who is currently performing a series of tests on him.  The patient tells me he is applying for disability.  He reports significant shortness of breath that interferes with his ability to find meaningful employment.  I suggest he get a statement from his pulmonologist once his evaluation is complete.  If you have any further questions, please do not hesitate to contact me.      Sincerely,   Tereso Newcomer PA-C
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
## 2011-01-24 ENCOUNTER — Telehealth (INDEPENDENT_AMBULATORY_CARE_PROVIDER_SITE_OTHER): Payer: Self-pay | Admitting: *Deleted

## 2011-02-02 NOTE — Progress Notes (Signed)
Summary: sample of sprivia  Phone Note Call from Patient   Caller: Patient Call For: ramaswamy Summary of Call: patient is here now and having difficulty with his insurance and he wants to know if he can get a sample of Sprivia. He is making an appointment and than I will have him wait in the lobby Initial call taken by: Vedia Coffer,  January 24, 2011 1:22 PM  Follow-up for Phone Call        Sample if Spiriva given to pt.  Pt scheduled f/u appt with Dr Marchelle Gearing. Abigail Miyamoto RN  January 24, 2011 1:29 PM

## 2011-02-20 ENCOUNTER — Ambulatory Visit (INDEPENDENT_AMBULATORY_CARE_PROVIDER_SITE_OTHER): Payer: Self-pay | Admitting: Internal Medicine

## 2011-02-20 ENCOUNTER — Encounter: Payer: Self-pay | Admitting: Internal Medicine

## 2011-02-20 DIAGNOSIS — F17200 Nicotine dependence, unspecified, uncomplicated: Secondary | ICD-10-CM

## 2011-02-20 DIAGNOSIS — J449 Chronic obstructive pulmonary disease, unspecified: Secondary | ICD-10-CM

## 2011-02-20 DIAGNOSIS — M79609 Pain in unspecified limb: Secondary | ICD-10-CM
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
## 2011-03-03 LAB — BLOOD GAS, ARTERIAL
Acid-base deficit: 2 mmol/L (ref 0.0–2.0)
Bicarbonate: 22 mEq/L (ref 20.0–24.0)
Drawn by: 257081
O2 Saturation: 95 %
Patient temperature: 98.6
TCO2: 16.1 mmol/L (ref 0–100)
pCO2 arterial: 28.6 mmHg — ABNORMAL LOW (ref 35.0–45.0)
pH, Arterial: 7.346 — ABNORMAL LOW (ref 7.350–7.450)
pH, Arterial: 7.406 (ref 7.350–7.450)
pO2, Arterial: 96.8 mmHg (ref 80.0–100.0)

## 2011-03-10 ENCOUNTER — Encounter: Payer: Self-pay | Admitting: Nurse Practitioner

## 2011-03-10 ENCOUNTER — Encounter (INDEPENDENT_AMBULATORY_CARE_PROVIDER_SITE_OTHER): Payer: Self-pay | Admitting: Nurse Practitioner

## 2011-03-14 LAB — CONVERTED CEMR LAB: Retic Ct Pct: 0.6 % (ref 0.4–3.1)

## 2011-03-15 ENCOUNTER — Encounter: Payer: Self-pay | Admitting: Internal Medicine

## 2011-03-16 NOTE — Assessment & Plan Note (Signed)
Summary: Hand Pain   Vital Signs:  Patient profile:   50 year old male Weight:      222.6 pounds BMI:     34.99 O2 Sat:      97 % on Room air Temp:     97.8 degrees F oral Pulse rate:   77 / minute Pulse rhythm:   regular Resp:     20 per minute BP sitting:   118 / 82  (left arm) Cuff size:   large  Vitals Entered By: Levon Hedger (March 10, 2011 2:41 PM)  Nutrition Counseling: Patient's BMI is greater than 25 and therefore counseled on weight management options.  O2 Flow:  Room air  Serial Vital Signs/Assessments:  Comments: P/F  300,  300,  330 By: Levon Hedger   CC: renew medication...swelling in hands...discomfort with walking, Depression, Abdominal Pain, Back Pain Is Patient Diabetic? No Pain Assessment Patient in pain? yes     Location: lnee, arm,chest Intensity: 5  Does patient need assistance? Functional Status Self care Ambulation Normal   Primary Care Provider:  Tereso Newcomer PA-C  CC:  renew medication...swelling in hands...discomfort with walking, Depression, Abdominal Pain, and Back Pain.  History of Present Illness:  Pt into the office for f/u on breathing. Pt does f/u with pulmonologist every 6 months.  Pt reports that he is waiting for some additional testing.  Back pain - MRI done in 07/2010 Normal examination of the lumbar spine.  No cause of pain identified in that region.  The patient does have some degenerative change of the sacroiliac joints which could possibly relate to symptoms.  This is more notable on the right.   Hand pain - Pt reports that he is having increasing problems with his hand. Right hand dominant.  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  The pain is located in the lower back region and does not radiate below the knees.  The following makes the back pain worse: excessive ambulation.      Critical Exclusionary Diagnosis Criteria (CEDC) for Back Pain:      The patient denies a history of previous trauma.  He has no prior history of spinal surgery.  There are no symptoms to suggest infection, cauda equina, or psychosocial factors for back pain.  Other positive CEDC factors include low back pain worse with activity.   The patient denies that he feels like life is not worth living, denies that he wishes that he were dead, and denies that he has thought about ending his life.         Dyspepsia History:      He has no alarm features of dyspepsia including no history of melena, hematochezia, dysphagia, persistent vomiting, or involuntary weight loss > 5%.  There is a prior history of GERD.  The patient does not have a prior history of documented ulcer disease.  The dominant symptom is not heartburn or acid reflux.  An H-2 blocker medication is currently being taken.  A prior EGD has been done.           Habits & Providers  Alcohol-Tobacco-Diet     Alcohol drinks/day: <1     Tobacco Status: current     Tobacco Counseling: to quit use of tobacco products     Cigarette Packs/Day: 4.0  Year Started: 1970     Year Quit: quit may 2011, relapsed jan 2012     Pack years: 160  Exercise-Depression-Behavior     Have you felt down or hopeless? yes     Have you felt little pleasure in things? yes     Depression Counseling: further diagnostic testing and/or other treatment is indicated     Drug Use: no  Comments: Pt has been to see Aquilla Solian as instructed by the previous provider and he is not interested in seeing a counselor here  Allergies (verified): 1)  ! * Preservations  Review of Systems General:  Complains of fatigue. CV:  Denies chest pain or discomfort. Resp:  Complains of shortness of breath. GI:  Denies abdominal pain, nausea, and vomiting. MS:  Complains of joint pain and low back pain. Neuro:  Complains of numbness; bil feet with numbess in the morning -  cold.  Physical Exam  General:  alert.   Head:  normocephalic.  beard and mustache long hair Mouth:  poor dentition.   Lungs:  decreased bases Heart:  normal rate and regular rhythm.   Neurologic:  alert & oriented X3.     Wrist/Hand Exam  General:    obese.    Hand Exam:    Right:    Inspection:  Abnormal    Palpation:  Abnormal    inflammed PIP joints    Left:    Inspection:  Abnormal    Palpation:  Abnormal    inflammed PIP joints    decreased ROM   Impression & Recommendations:  Problem # 1:  DEPRESSION, MILD (ICD-311) screen + today but pt does not wish to see mental health provider  Problem # 2:  GERD (ICD-530.81) will refill meds His updated medication list for this problem includes:    Pepcid 20 Mg Tabs (Famotidine) .Marland Kitchen... Take 1 tablet by mouth two times a day as needed for stomach  Problem # 3:  HAND PAIN, RIGHT (ICD-729.5) will check labs Orders: T-Uric Acid (Blood) ([id number]) T-Antinuclear Antib (ANA) [id number]) T-C-Reactive Protein [id number]) T-Sed Rate (Automated) ([id number]) T-Reticulocyte Count, Manual (28413)  Problem # 4:  LUMBAGO (ICD-724.2) MRI done back in August 2011 The following medications were removed from the medication list:    Diclofenac Sodium 75 Mg Tbec (Diclofenac sodium) .Marland Kitchen... Take 1 tablet by mouth two times a day as needed for pain His updated medication list for this problem includes:    Flexeril 10 Mg Tabs (Cyclobenzaprine hcl) .Marland Kitchen... Take 1 tab by mouth at bedtime as needed for muscle spasms    Aspir-low 81 Mg Tbec (Aspirin) .Marland Kitchen... Take 1 tablet by mouth once a day    Tramadol Hcl 50 Mg Tabs (Tramadol hcl) ..... One tablet by mouth two times a day as needed for pain    Meloxicam 15 Mg Tabs (Meloxicam) ..... One tablet by mouth daily for joints  Problem # 5:  TOBACCO ABUSE (ICD-305.1) advised cessation  Complete Medication List: 1)  Miralax Powd (Polyethylene glycol 3350) .Marland Kitchen.. 1 capful daily.  stop if you  develop diarrhea. 2)  Proventil Hfa 108 (90 Base) Mcg/act Aers (Albuterol sulfate) .Marland Kitchen.. 1-2 puffs q 4-6 hours as needed 3)  Mucinex 600 Mg Xr12h-tab (Guaifenesin) .... Take 1 tablet by mouth two times a day as needed for cough and congestion 4)  Flexeril 10 Mg Tabs (Cyclobenzaprine hcl) .... Take 1 tab by mouth at bedtime as needed for muscle spasms 5)  Duoneb 0.5-2.5 (3) Mg/27ml Soln (Ipratropium-albuterol) .Marland KitchenMarland KitchenMarland Kitchen  Use in nebulizer two times a day to four times a day 6)  Aspir-low 81 Mg Tbec (Aspirin) .... Take 1 tablet by mouth once a day 7)  Benadryl 25 Mg Caps (Diphenhydramine hcl) .... Per bottle 8)  Flomax 0.4 Mg Caps (Tamsulosin hcl) .... Take 1 tab by mouth at bedtime for prostate 9)  Pepcid 20 Mg Tabs (Famotidine) .... Take 1 tablet by mouth two times a day as needed for stomach 10)  Spiriva Handihaler 18 Mcg Caps (Tiotropium bromide monohydrate) .... Once daily 11)  Tramadol Hcl 50 Mg Tabs (Tramadol hcl) .... One tablet by mouth two times a day as needed for pain 12)  Meloxicam 15 Mg Tabs (Meloxicam) .... One tablet by mouth daily for joints  Other Orders: Peak Flow Rate (94150) Pulse Oximetry (single measurment) (16109)  Patient Instructions: 1)  Your labs will be checked today for causes of swelling in your hands.  This may be due to wear over the years from arthritis. 2)  Start meloxicam 15mg  by mouth daily for your joints and back. 3)  Also start tramadol as needed for pain 4)  Keep your appointment with the pulmonologist. Prescriptions: MELOXICAM 15 MG TABS (MELOXICAM) One tablet by mouth daily for joints  #30 x 3   Entered and Authorized by:   Lehman Prom FNP   Signed by:   Lehman Prom FNP on 03/10/2011   Method used:   Print then Give to Patient   RxID:   [id number] TRAMADOL HCL 50 MG TABS (TRAMADOL HCL) One tablet by mouth two times a day as needed for pain  #30 x 0   Entered and Authorized by:   Lehman Prom FNP   Signed by:   Lehman Prom FNP on  03/10/2011   Method used:   Print then Give to Patient   RxID:   [id number] PEPCID 20 MG TABS (FAMOTIDINE) Take 1 tablet by mouth two times a day as needed for stomach  #60 x 5   Entered and Authorized by:   Lehman Prom FNP   Signed by:   Lehman Prom FNP on 03/10/2011   Method used:   Print then Give to Patient   RxID:   [id number] FLEXERIL 10 MG TABS (CYCLOBENZAPRINE HCL) Take 1 tab by mouth at bedtime as needed for muscle spasms  #30 x 1   Entered and Authorized by:   Lehman Prom FNP   Signed by:   Lehman Prom FNP on 03/10/2011   Method used:   Print then Give to Patient   RxID:   [id number]    Orders Added: 1)  Est. Patient Level III [63875] 2)  Peak Flow Rate [94150] 3)  Pulse Oximetry (single measurment) [94760] 4)  T-Uric Acid (Blood) [84550-23180] 5)  T-Antinuclear Antib (ANA) [[id number]] 6)  T-C-Reactive Protein [41660-63016] 7)  T-Sed Rate (Automated) [01093-23557] 8)  T-Reticulocyte Count, Manual [32202]

## 2011-03-24 LAB — BASIC METABOLIC PANEL
BUN: 9 mg/dL (ref 6–23)
GFR calc Af Amer: >60 mL/min (ref >60)
GFR calc non Af Amer: >60 mL/min (ref >60)
Potassium: 4.1 mEq/L (ref 3.5–5.1)
Sodium: 140 mEq/L (ref 135–145)

## 2011-04-19 ENCOUNTER — Telehealth: Payer: Self-pay | Admitting: Internal Medicine

## 2011-04-19 NOTE — Telephone Encounter (Signed)
Results were in Epic and were normal. Pt advised.John Forbes, CMA

## 2011-04-19 NOTE — Telephone Encounter (Signed)
Spoke w/ pt and he was last seen 35/12 and had an alpha 1 test done. Pt states he never heard back about these results. Please advise MR results is in epic under labs. Pt requesting call back by Friday. Pt is ware your not in the office until then.   Carver Fila, CMA

## 2011-11-27 ENCOUNTER — Encounter: Payer: Self-pay | Admitting: Internal Medicine

## 2011-11-28 ENCOUNTER — Encounter: Payer: Self-pay | Admitting: Internal Medicine

## 2011-11-28 ENCOUNTER — Ambulatory Visit (INDEPENDENT_AMBULATORY_CARE_PROVIDER_SITE_OTHER): Payer: Self-pay | Admitting: Internal Medicine

## 2011-11-28 VITALS — BP 132/80 | HR 93 | Temp 97.8°F | Ht 71.0 in | Wt 241.2 lb

## 2011-11-28 DIAGNOSIS — F17200 Nicotine dependence, unspecified, uncomplicated: Secondary | ICD-10-CM

## 2011-11-28 DIAGNOSIS — J449 Chronic obstructive pulmonary disease, unspecified: Secondary | ICD-10-CM

## 2011-11-28 DIAGNOSIS — R0602 Shortness of breath: Secondary | ICD-10-CM

## 2011-11-28 DIAGNOSIS — J4489 Other specified chronic obstructive pulmonary disease: Secondary | ICD-10-CM

## 2011-11-28 NOTE — Assessment & Plan Note (Signed)
Ischemic heart disease was not picked up on CPST and in nuc med stress test in feb 2011.John Forbes patient believes that he has IHD and with prior ef 49% perhaps it is worth a relook. I will get him back to Dr Mayford Knife. Tough situatin: he wont quit smoking and wont attend rehab and has pain issues and suspect secondary gain due to disability application

## 2011-11-28 NOTE — Patient Instructions (Signed)
Please visit with Dr Armanda Magic cardiology and readdress question of heart causing shortness of breath Your shortness of breath is some from copd and some from being out of shape but we need to make sure there are no heart issues I will see you back in 4-6 months

## 2011-11-28 NOTE — Progress Notes (Signed)
Subjective:    Patient ID: John Forbes, male    DOB: [birth year], 50 y.o.   MRN: [id number]  HPI  Smoker who quit May 2011. Has class 2 dyspnea. Started in 1995 when he inhaled ammonia gas following an exposure. Cardiac stress test negative feb 2011 . Echo jan 2011 suggested RV dysfn. Also, he is trying to apply for [place] disability   OV 08/23/2010: Last visit was in June 2011; the first visit. PFts done while on advair showed isolated reduction in DLCO. So, he underwent CT chestin June 2011. This showe diffuse emphysema. However, due to Disability appliocation and symptoms being out of proprition to degree of PFT abnormality we did a CPST. He underwent first CPST on 06/22/2010. There was question of submaximal effort. So, we repeatd it on 08/16/2010 with pre- and peak- ABG. Results in both tests are very similar. Effort good based on abg. Spirometry suggested mild-moderate copd both occassions. He was limited due to ventilatory reasons with high dead space ventilation c/w COPD. However, Vo2 max was consistently > 74ml/kg/min. Therefore, not a candidate for permanent disability. In terms of symptoms he is now on spiriva and reporting improved dyspnea and cough. Only new issue is that he is c/opNEW right wrist pain following ABG. It is constant and dull and gets worse when he extends arm out. Sudden onset following abg stick on 08/16/2010. Slowly improving. No associated swelling or weakness.    February 20, 2011: Followup Gold stage 1 cOPD and tobacco abuse, DISAbility app. Still continuing spiriva. Refused to attend rehab due to $ issues. Has relapsed into smoking again - lot of pscyhosocial stress at home and $ issues. Stil wiht Class 2 dyspnea - 2 blocks. No change in baseline mild smoker's cough. No new issues either. After walking 155 feet x 3 laps in our office today he DID NOT desaturate. Of note, he is having atypical chest pain. States he had cardiac stress test and has been cleared by [facility] cardiology       Patient Instructions:  1) #COPD  2) - please continue spiriva  3) - will call you with genetic test result  4) #SHORTNESS OF BREATH  5) - this is due to the copd but also you being out of shape  6) - rehab will really help - let me know when you ar ready  7) #SMOKING  8) - please work on quitting  9) #FOLLOOWUP  10) - 9 months  OV 11/28/2011 FU dyspnea, copd stage 1, tobacco abuse. States class 2-3 exertional dyspnea no better., Cough only mild and baseline. Overall unchanged health. Has constant atypical chest pain. He feels it is cardiac. He says cards cleared him but he feels they were wrong. I was able to review his nuc med stress test from feb 2011 which is avail f to me and it says   Left ventricular ejection fraction: Calculated left ventricular  ejection fraction = 49%  IMPRESSION:  1. No reversible ischemia.  2. Mild inferior wall hypokinesis.  3. Left ventricular ejection fraction equal 49 %  He is still smoking. Cannot quit. Refuses to go to rehab. Tough situation - unwillng to quit smoking, unwilling to attend rehab and chronic pain issues with possible secondary gain     Review of Systems  Constitutional: Negative for fever and unexpected weight change.  HENT: Negative for ear pain, nosebleeds, congestion, sore throat, rhinorrhea, sneezing, trouble swallowing, dental problem, postnasal drip and sinus pressure.   Eyes: Negative for redness  and itching.  Respiratory: Positive for cough, chest tightness, shortness of breath and wheezing.   Cardiovascular: Negative for palpitations and leg swelling.  Gastrointestinal: Negative for nausea and vomiting.  Genitourinary: Negative for dysuria.  Musculoskeletal: Negative for joint swelling.  Skin: Negative for rash.  Neurological: Negative for headaches.  Hematological: Does not bruise/bleed easily.  Psychiatric/Behavioral: Negative for dysphoric mood. The patient is not nervous/anxious.        Objective:    Physical Exam Physical Exam  General: well developed, well nourished, in no acute distress  Head: normocephalic and atraumatic  Eyes: PERRLA/EOM intact; conjunctiva and sclera clear  Ears: TMs intact and clear with normal canals  Nose: no deformity, discharge, inflammation, or lesions  Mouth: no deformity or lesions  Neck: no masses, thyromegaly, or abnormal cervical nodes  Chest Wall: no deformities noted  Lungs: clear bilaterally to auscultation and percussion  Heart: regular rate and rhythm, S1, S2 without murmurs, rubs, gallops, or clicks  Abdomen: bowel sounds positive; abdomen soft and non-tender without masses, or organomegaly  Msk: no deformity or scoliosis noted with normal posture  Pulses: pulses normal  Extremities: no clubbing, cyanosis, edema, or deformity noted  RT forearm, wrist and hand - looks normal. No sweling. Normal range of motion. Normal pulses. Normal sensation. Normal strenght. He is tender when radial artery is palpated but no deficits  Neurologic: CN II-XII grossly intact with normal reflexes, coordination, muscle strength and tone  Skin: intact  tatoos +  Cervical Nodes: no significant adenopathy  Axillary Nodes: no significant adenopathy  Psych: alert and cooperative; normal mood and affect; normal attention span and concentration       Assessment & Plan:

## 2011-11-30 NOTE — Assessment & Plan Note (Signed)
Last classified as Gold stage I COPD. Currently stable disease. No evidence of exacerbation. However her dyspnea is out of proportion to severely of COPD. We always knew this. Once he finishes a reevaluation by cardiology we'll get spirometry to track current status of COPD especially with ongoing smoking

## 2011-11-30 NOTE — Assessment & Plan Note (Signed)
Unwilling and unable to quit smoking. We'll continue to monitor

## 2011-12-04 ENCOUNTER — Other Ambulatory Visit: Payer: Self-pay | Admitting: Cardiology

## 2011-12-04 ENCOUNTER — Encounter: Payer: Self-pay | Admitting: *Deleted

## 2011-12-27 ENCOUNTER — Ambulatory Visit (HOSPITAL_COMMUNITY)
Admission: RE | Admit: 2011-12-27 | Discharge: 2011-12-27 | Disposition: A | Discharge status: Home / Self Care | Payer: Self-pay | Source: Ambulatory Visit | Attending: Cardiology | Admitting: Cardiology

## 2011-12-27 DIAGNOSIS — R079 Chest pain, unspecified: Secondary | ICD-10-CM | POA: Insufficient documentation

## 2011-12-27 DIAGNOSIS — J438 Other emphysema: Secondary | ICD-10-CM | POA: Insufficient documentation

## 2011-12-27 MED ORDER — IOHEXOL 350 MG/ML SOLN
80.0000 mL | Freq: Once | INTRAVENOUS | Status: AC | PRN
  Administered 2011-12-27: 80 mL via INTRAVENOUS

## 2011-12-27 MED ORDER — METOPROLOL TARTRATE 1 MG/ML IV SOLN
5.0000 mg | Freq: Once | INTRAVENOUS | Status: AC
  Administered 2011-12-27: 5 mg via INTRAVENOUS

## 2011-12-27 MED ORDER — METOPROLOL TARTRATE 1 MG/ML IV SOLN
INTRAVENOUS | Status: AC
  Filled 2011-12-27: qty 5

## 2011-12-27 MED ORDER — METOPROLOL TARTRATE 1 MG/ML IV SOLN
INTRAVENOUS | Status: AC
  Administered 2011-12-27: 5 mg via INTRAVENOUS
  Filled 2011-12-27: qty 5

## 2011-12-27 MED ORDER — NITROGLYCERIN 0.4 MG SL SUBL: 0.4000 mg | SUBLINGUAL_TABLET | SUBLINGUAL | Status: DC | PRN

## 2011-12-27 MED ORDER — NITROGLYCERIN 0.4 MG SL SUBL
SUBLINGUAL_TABLET | SUBLINGUAL | Status: AC
  Administered 2011-12-27: 0.4 mg via SUBLINGUAL
  Filled 2011-12-27: qty 25

## 2011-12-27 NOTE — Progress Notes (Signed)
Realized sent patient home with his NS loc insitu. Had told patient to remind me to take his IV out before he left. Called patient. Patient stated he realized IV was still in before he got to his truck in the hospital parking lot but did not return to have it removed. Does not want to return to have it removed despite encouragement to do so. States he will remove it and has removed IV's before. Told patient to make sure to put pressure on site for at least 3 minutes.

## 2011-12-27 NOTE — Progress Notes (Signed)
Denies ever taking nitro and is not on any erectile dysfunction meds.Not diabetic, currently smokes. Hx. Of rheumatic fever.

## 2012-01-08 ENCOUNTER — Encounter: Payer: Self-pay | Admitting: Cardiology

## 2012-01-08 ENCOUNTER — Other Ambulatory Visit: Payer: Self-pay | Admitting: Cardiology

## 2012-01-08 MED ORDER — SODIUM CHLORIDE 0.9 % IJ SOLN: 3.0000 mL | INTRAMUSCULAR | Status: DC | PRN

## 2012-01-08 MED ORDER — SODIUM CHLORIDE 0.9 % IV SOLN: 250.0000 mL | INTRAVENOUS | Status: DC | PRN

## 2012-01-08 MED ORDER — SODIUM CHLORIDE 0.9 % IJ SOLN: 3.0000 mL | Freq: Two times a day (BID) | INTRAMUSCULAR | Status: DC

## 2012-01-08 NOTE — H&P (Signed)
Office Visit     Patient: John Forbes Provider: Armanda Magic, MD  DOB: April 05, 1961 Age: 51 Y Sex: Male Date: 01/05/2012  Phone: [phone]   Address: [address] [place], [place], [id number]       Subjective:     CC:    1. TT/CTA f/u.        HPI:  General:  The patient presents for evaluation of chest pain. He initially presented about 2 years ago and underwent nuclear stress test that did not show any ischemia. He states that he has a chronic pain over his left rib that is constant and never goes away and is described as a throbbing sensation. He has another discomfort on both sides of his chest that comes on when he exerts himself.. He has severe lung disease and recently saw Dr. Colletta Maryland who told him the CP was not from COPD. He has chronic SOB from underlying lung disease but this has improved with inhalers. He is now here for further evaluation. He states that since I saw him 2 years ago he has continued to have constant pain in his left chest over a pinpoint area that is worse with palpitaitons. He also complains of a constant pressure during the day that eases up at night and this has been going on for over 2 years. He has DOE that is chronic. He is trying to get disability. He recently underwent coronary CTA which showed a high grade lesion in the RCA..        ROS:  See HPI, A twelve system review was perfomed at today's visit. For pertinent positives and negatives see HPI.       Medical History: COPD with inhalation of toxic gas in 1995, Rheumatic fever, abnormal cardiac CTA with high grade RCA lesion.        Surgical History: tonsillectomy , vasectomy .        Family History: Father: deceased brain aneurysm with CVA Mother: alive cancer Brother 1: deceased colon CA        Social History:  General:  no Alcohol, alcoholic all his life but quit 03/2009.  Caffeine: yes, 2+ servings daily, coffee.  Marital Status: single, Separated.  Children: 4 children.         Medications: Advair Diskus 500-50 MCG/DOSE Miscellaneous 1 puff every 12 hrs, Albuterol Sulfate HFA 108 (90 Base) MCG/ACT Aerosol Solution 2 puffs as needed every 4 hrs, Benadryl 25 MG Capsule 1 capsule daily, Mucinex 600 MG Tablet Extended Release 12 Hour 1 tablet for chest congestion every 12 hrs, Tamsulosin HCl 0.4 MG Capsule 1 capsule 30 minutes after the same meal each day Once a day, Tramadol HCl 50 MG Tablet Soluble 1 tablet twice a day as needed, Indomethacin 25 MG Capsule 1 capsule with food Twice a day as needed, Ranitidine HCl 150 MG Tablet 1 tablet Twice a day, Pantoprazole Sodium 40 MG Tablet Delayed Release 1 tablet Once a day, Cyclobenzaprine HCl 10 MG Tablet 1 tablet at bedtime as needed, Medication List reviewed and reconciled with the patient       Allergies: Preservatives: swelling of throat: Allergy.       Objective:     Vitals: Wt 239, Wt change 2 lb, Ht 71, BMI 33.33, Pulse sitting 68, BP sitting 128/88.       Examination:  Cardiology, General:  GENERAL APPEARANCE: pleasant, NAD.  HEENT: unremarkable.  CAROTID UPSTROKE: normal, no bruit.  JVD: flat.  HEART SOUNDS: regular, normal S1, S2, no S3 or  S4.  MURMUR: absent.  LUNGS: no rales or wheezes.  ABDOMEN: soft, non tender, positive bowel sounds, no masses felt.  EXTREMITIES: no leg edema.  PERIPHERAL PULSES: 2 plus bilateral.        Assessment:     Assessment:  1. SOB (shortness of breath) - 786.05 (Primary)  2. CAD in native artery - 414.01  3. COPD (chronic obstructive pulmonary disease) - 496  4. Abnormal cardiovascular function study - 794.30    Plan:     1. CAD in native artery  LAB: PT (Prothrombin Time) (865784) (Ordered for 01/05/2012) Normal    Prothrombin Time 10.2 9.1-12.0 - SEC    INR 0.9 0.8-1.2 -     TURNER,TRACI M 01/08/2012 08:14:59 AM > for cath    LAB: Basic Metabolic (Ordered for 01/05/2012) Stable    GLUCOSE 87 70-99 - mg/dL    BUN 13 6-96 - mg/dL    CREATININE 2.95  2.84-1.32 - mg/dl    eGFR (NON-AFRICAN AMERICAN) 71 >60 - calc    eGFR (AFRICAN AMERICAN) 86 >60 - calc    SODIUM 139 136-145 - mmol/L    POTASSIUM 4.5 3.5-5.5 - mmol/L    CHLORIDE 109 98-107 - mmol/L H   C02 21 22-32 - mg/dL L   ANION GAP 44.0 1.0-27.2 - mmol/L    CALCIUM 9.3 8.6-10.3 - mg/dL     TURNER,TRACI M 53/66/4403 08:14:32 AM > pleae forward to priamry MD    LAB: CBC with Diff (Ordered for 01/05/2012) Normal    WBC 7.3 4.0-11.0 - K/ul    RBC 4.91 4.20-5.80 - M/uL    HGB 15.2 13.0-17.0 - g/dL    HCT 47.4 25.9-56.3 - %    MCH 30.9 27.0-33.0 - pg    MPV 9.1 7.5-10.7 - fL    MCV 90.5 80.0-94.0 - fL    MCHC 34.2 32.0-36.0 - g/dL    RDW 87.5 64.3-32.9 - %    PLT 140 150-400 - K/uL L   NEUT % 56.4 43.3-71.9 - %    LYMPH% 31.3 16.8-43.5 - %    MONO % 8.4 4.6-12.4 - %    EOS % 3.0 0.0-7.8 - %    BASO % 0.9 0.0-1.0 - %    NEUT # 4.1 1.9-7.2 - K/uL    LYMPH# 2.30 1.10-2.70 - K/uL    MONO # 0.6 0.3-0.8 - K/uL    EOS # 0.2 0.0-0.6 - K/uL    BASO # 0.1 0.0-0.1 - K/uL     TURNER,TRACI M 01/05/2012 03:04:33 PM > Harward,Amy 01/05/2012 04:40:23 PM > noted for cath, will fax to cath lab.    LAB: PT (Prothrombin Time) ([id number]) (Ordered for 01/05/2012) Normal    Prothrombin Time 10.2 9.1-12.0 - SEC    INR 0.9 0.8-1.2 -     TURNER,TRACI M 01/08/2012 08:14:59 AM > for cath    LAB: Basic Metabolic (Ordered for 01/05/2012) Stable    GLUCOSE 87 70-99 - mg/dL    BUN 13 6-60 - mg/dL    CREATININE 6.30 1.60-1.09 - mg/dl    eGFR (NON-AFRICAN AMERICAN) 71 >60 - calc    eGFR (AFRICAN AMERICAN) 86 >60 - calc    SODIUM 139 136-145 - mmol/L    POTASSIUM 4.5 3.5-5.5 - mmol/L    CHLORIDE 109 98-107 - mmol/L H   C02 21 22-32 - mg/dL L   ANION GAP 32.3 5.5-73.2 - mmol/L    CALCIUM 9.3 8.6-10.3 - mg/dL     Armanda Magic M  01/08/2012 08:14:32 AM > pleae forward to priamry MD    LAB: CBC with Diff (Ordered for 01/05/2012) Normal    WBC 7.3 4.0-11.0 - K/ul    RBC 4.91 4.20-5.80 - M/uL    HGB  15.2 13.0-17.0 - g/dL    HCT 40.9 81.1-91.4 - %    MCH 30.9 27.0-33.0 - pg    MPV 9.1 7.5-10.7 - fL    MCV 90.5 80.0-94.0 - fL    MCHC 34.2 32.0-36.0 - g/dL    RDW 78.2 95.6-21.3 - %    PLT 140 150-400 - K/uL L   NEUT % 56.4 43.3-71.9 - %    LYMPH% 31.3 16.8-43.5 - %    MONO % 8.4 4.6-12.4 - %    EOS % 3.0 0.0-7.8 - %    BASO % 0.9 0.0-1.0 - %    NEUT # 4.1 1.9-7.2 - K/uL    LYMPH# 2.30 1.10-2.70 - K/uL    MONO # 0.6 0.3-0.8 - K/uL    EOS # 0.2 0.0-0.6 - K/uL    BASO # 0.1 0.0-0.1 - K/uL     TURNER,TRACI M 01/05/2012 03:04:33 PM > Harward,Amy 01/05/2012 04:40:23 PM > noted for cath, will fax to cath lab.    Diagnostic Imaging:Cardiac Cath (Ordered for 01/12/2012)  I have reviewed the findings of the cardiac coronary CTA with the patient. I thinks his SOB is probably from the RCA lesion. I will set him up for cath next Friday in the main cath lab when Dr. Katrinka Blazing is available for PCI., Risks and benefits of cardiac catheterization have been reviewed including risk of stroke, heart attack, death, bleeding, renal impariment and arterial damage. There was ample oppurtuny to answer questions. Alternatives were discussed. Patient understands and wishes to proceed.        Immunizations:        Labs:        Procedure Codes: 08657 BLOOD COLLECTION ROUTINE VENIPUNCTURE, 80048 ECL BMP, 85025 ECL CBC PLATELET DIFF       Preventive:         Follow Up: cTAH      Provider: Armanda Magic, MD  Patient: Trajan, Grove DOB: Feb 25, 1961 Date: 01/05/2012

## 2012-01-09 ENCOUNTER — Other Ambulatory Visit: Payer: Self-pay | Admitting: Cardiology

## 2012-01-10 ENCOUNTER — Telehealth: Payer: Self-pay | Admitting: Internal Medicine

## 2012-01-10 NOTE — Telephone Encounter (Signed)
Got note from cards Dr Malachy Mood that CT heart showed 'debris in left main strem bronchus'. This was not the case in older ? 2011 ct. I last saw him in December 2012 and plan was 4-6 months fu. [name] ehave him him come in now first avail to disuss and review findings

## 2012-01-11 NOTE — Telephone Encounter (Signed)
Pt aware and ov set for 01-30-12 at 3:15. Carron Curie, CMA

## 2012-01-12 ENCOUNTER — Ambulatory Visit (HOSPITAL_COMMUNITY)
Admission: RE | Admit: 2012-01-12 | Discharge: 2012-01-13 | Disposition: A | Discharge status: Home / Self Care | Payer: Self-pay | Source: Ambulatory Visit | Attending: Cardiology | Admitting: Cardiology

## 2012-01-12 ENCOUNTER — Encounter (HOSPITAL_COMMUNITY): Payer: Self-pay | Admitting: Cardiology

## 2012-01-12 ENCOUNTER — Other Ambulatory Visit: Payer: Self-pay

## 2012-01-12 ENCOUNTER — Encounter (HOSPITAL_COMMUNITY)
Admission: RE | Disposition: A | Discharge status: Home / Self Care | Payer: Self-pay | Source: Ambulatory Visit | Attending: Cardiology

## 2012-01-12 DIAGNOSIS — R0602 Shortness of breath: Secondary | ICD-10-CM | POA: Insufficient documentation

## 2012-01-12 DIAGNOSIS — I209 Angina pectoris, unspecified: Secondary | ICD-10-CM | POA: Insufficient documentation

## 2012-01-12 DIAGNOSIS — J449 Chronic obstructive pulmonary disease, unspecified: Secondary | ICD-10-CM | POA: Insufficient documentation

## 2012-01-12 DIAGNOSIS — I2510 Atherosclerotic heart disease of native coronary artery without angina pectoris: Secondary | ICD-10-CM | POA: Insufficient documentation

## 2012-01-12 DIAGNOSIS — J4489 Other specified chronic obstructive pulmonary disease: Secondary | ICD-10-CM | POA: Insufficient documentation

## 2012-01-12 HISTORY — DX: Personal history of other diseases of the digestive system: Z87.19

## 2012-01-12 HISTORY — DX: Gastro-esophageal reflux disease without esophagitis: K21.9

## 2012-01-12 HISTORY — PX: PERCUTANEOUS CORONARY STENT INTERVENTION (PCI-S): SHX5485

## 2012-01-12 HISTORY — DX: Angina pectoris, unspecified: I20.9

## 2012-01-12 HISTORY — PX: LEFT HEART CATHETERIZATION WITH CORONARY ANGIOGRAM: SHX5451

## 2012-01-12 HISTORY — DX: Atherosclerotic heart disease of native coronary artery without angina pectoris: I25.10

## 2012-01-12 LAB — POCT ACTIVATED CLOTTING TIME: Activated Clotting Time: 419 seconds

## 2012-01-12 SURGERY — LEFT HEART CATHETERIZATION WITH CORONARY ANGIOGRAM
Anesthesia: LOCAL

## 2012-01-12 MED ORDER — TICAGRELOR 90 MG PO TABS: 90.0000 mg | ORAL_TABLET | Freq: Two times a day (BID) | ORAL | Status: DC

## 2012-01-12 MED ORDER — GUAIFENESIN ER 600 MG PO TB12
600.0000 mg | ORAL_TABLET | Freq: Two times a day (BID) | ORAL | Status: DC | PRN
  Filled 2012-01-12: qty 1

## 2012-01-12 MED ORDER — ROSUVASTATIN CALCIUM 40 MG PO TABS: 40.0000 mg | ORAL_TABLET | Freq: Every day | ORAL | Status: DC

## 2012-01-12 MED ORDER — BIVALIRUDIN 250 MG IV SOLR
INTRAVENOUS | Status: AC
  Filled 2012-01-12: qty 250

## 2012-01-12 MED ORDER — ADENOSINE 12 MG/4ML IV SOLN
16.0000 mL | Freq: Once | INTRAVENOUS | Status: DC
  Filled 2012-01-12: qty 16

## 2012-01-12 MED ORDER — ASPIRIN EC 325 MG PO TBEC: 325.0000 mg | DELAYED_RELEASE_TABLET | Freq: Every day | ORAL | Status: DC

## 2012-01-12 MED ORDER — INDOMETHACIN 25 MG PO CAPS
25.0000 mg | ORAL_CAPSULE | Freq: Two times a day (BID) | ORAL | Status: DC | PRN
  Filled 2012-01-12: qty 1

## 2012-01-12 MED ORDER — ROSUVASTATIN CALCIUM 40 MG PO TABS
40.0000 mg | ORAL_TABLET | Freq: Every day | ORAL | Status: DC
  Administered 2012-01-12: 40 mg via ORAL
  Filled 2012-01-12 (×2): qty 1

## 2012-01-12 MED ORDER — METOPROLOL TARTRATE 25 MG PO TABS
25.0000 mg | ORAL_TABLET | Freq: Two times a day (BID) | ORAL | Status: DC
  Administered 2012-01-12 – 2012-01-13 (×2): 25 mg via ORAL
  Filled 2012-01-12 (×3): qty 1

## 2012-01-12 MED ORDER — ONDANSETRON HCL 4 MG/2ML IJ SOLN: 4.0000 mg | Freq: Four times a day (QID) | INTRAMUSCULAR | Status: DC | PRN

## 2012-01-12 MED ORDER — SODIUM CHLORIDE 0.9 % IV SOLN
INTRAVENOUS | Status: DC
  Administered 2012-01-12: 06:00:00 via INTRAVENOUS

## 2012-01-12 MED ORDER — ASPIRIN EC 81 MG PO TBEC
81.0000 mg | DELAYED_RELEASE_TABLET | Freq: Every day | ORAL | Status: DC
  Administered 2012-01-13: 81 mg via ORAL
  Filled 2012-01-12: qty 1

## 2012-01-12 MED ORDER — CYCLOBENZAPRINE HCL 10 MG PO TABS
10.0000 mg | ORAL_TABLET | Freq: Three times a day (TID) | ORAL | Status: DC | PRN
  Administered 2012-01-12: 10 mg via ORAL
  Filled 2012-01-12: qty 1

## 2012-01-12 MED ORDER — FAMOTIDINE 20 MG PO TABS
20.0000 mg | ORAL_TABLET | Freq: Two times a day (BID) | ORAL | Status: DC
  Administered 2012-01-12 – 2012-01-13 (×3): 20 mg via ORAL
  Filled 2012-01-12 (×4): qty 1

## 2012-01-12 MED ORDER — NITROGLYCERIN 0.4 MG SL SUBL: 0.4000 mg | SUBLINGUAL_TABLET | SUBLINGUAL | Status: DC | PRN

## 2012-01-12 MED ORDER — ASPIRIN 81 MG PO CHEW
324.0000 mg | CHEWABLE_TABLET | ORAL | Status: AC
  Administered 2012-01-12: 324 mg via ORAL
  Filled 2012-01-12: qty 1

## 2012-01-12 MED ORDER — FENTANYL CITRATE 0.05 MG/ML IJ SOLN
INTRAMUSCULAR | Status: AC
  Filled 2012-01-12: qty 2

## 2012-01-12 MED ORDER — SODIUM CHLORIDE 0.9 % IV SOLN
INTRAVENOUS | Status: AC
  Administered 2012-01-12: 15:00:00 via INTRAVENOUS

## 2012-01-12 MED ORDER — SODIUM CHLORIDE 0.9 % IV SOLN
0.2500 mg/kg/h | INTRAVENOUS | Status: DC
  Filled 2012-01-12: qty 250

## 2012-01-12 MED ORDER — TICAGRELOR 90 MG PO TABS
ORAL_TABLET | ORAL | Status: AC
  Filled 2012-01-12: qty 2

## 2012-01-12 MED ORDER — ASPIRIN EC 325 MG PO TBEC: 325.0000 mg | DELAYED_RELEASE_TABLET | Freq: Every day | ORAL | Status: AC

## 2012-01-12 MED ORDER — ACETAMINOPHEN 325 MG PO TABS: 650.0000 mg | ORAL_TABLET | ORAL | Status: DC | PRN

## 2012-01-12 MED ORDER — MIDAZOLAM HCL 2 MG/2ML IJ SOLN
INTRAMUSCULAR | Status: AC
  Filled 2012-01-12: qty 2

## 2012-01-12 MED ORDER — TICAGRELOR 90 MG PO TABS
90.0000 mg | ORAL_TABLET | Freq: Two times a day (BID) | ORAL | Status: DC
  Administered 2012-01-12 – 2012-01-13 (×2): 90 mg via ORAL
  Filled 2012-01-12 (×4): qty 1

## 2012-01-12 MED ORDER — ALBUTEROL SULFATE HFA 108 (90 BASE) MCG/ACT IN AERS: 2.0000 | INHALATION_SPRAY | Freq: Four times a day (QID) | RESPIRATORY_TRACT | Status: DC | PRN

## 2012-01-12 MED ORDER — TIOTROPIUM BROMIDE MONOHYDRATE 18 MCG IN CAPS
18.0000 ug | ORAL_CAPSULE | Freq: Every day | RESPIRATORY_TRACT | Status: DC
  Filled 2012-01-12: qty 5

## 2012-01-12 MED ORDER — DIAZEPAM 5 MG PO TABS
5.0000 mg | ORAL_TABLET | ORAL | Status: AC
  Administered 2012-01-12: 5 mg via ORAL
  Filled 2012-01-12: qty 1

## 2012-01-12 MED ORDER — PANTOPRAZOLE SODIUM 40 MG PO TBEC
40.0000 mg | DELAYED_RELEASE_TABLET | Freq: Every day | ORAL | Status: DC
  Administered 2012-01-12: 40 mg via ORAL
  Filled 2012-01-12: qty 1

## 2012-01-12 MED ORDER — TRAMADOL HCL 50 MG PO TABS
50.0000 mg | ORAL_TABLET | Freq: Two times a day (BID) | ORAL | Status: DC | PRN
  Filled 2012-01-12: qty 1

## 2012-01-12 MED ORDER — DIPHENHYDRAMINE HCL 25 MG PO TABS
25.0000 mg | ORAL_TABLET | Freq: Every day | ORAL | Status: DC
  Administered 2012-01-12: 25 mg via ORAL
  Filled 2012-01-12 (×2): qty 1

## 2012-01-12 NOTE — H&P (View-Only) (Signed)
Office Visit     Patient: John Forbes, Bion Provider: Trinita Devlin, MD  DOB: 05/14/1961 Age: 50 Y Sex: Male Date: 01/05/2012  Phone: [phone]   Address: [address], [place], [place]-[address]       Subjective:     CC:    1. TT/CTA f/u.        HPI:  General:  The patient presents for evaluation of chest pain. He initially presented about 2 years ago and underwent nuclear stress test that did not show any ischemia. He states that he has a chronic pain over his left rib that is constant and never goes away and is described as a throbbing sensation. He has another discomfort on both sides of his chest that comes on when he exerts himself.. He has severe lung disease and recently saw Dr. Ramaswami who told him the CP was not from COPD. He has chronic SOB from underlying lung disease but this has improved with inhalers. He is now here for further evaluation. He states that since I saw him 2 years ago he has continued to have constant pain in his left chest over a pinpoint area that is worse with palpitaitons. He also complains of a constant pressure during the day that eases up at night and this has been going on for over 2 years. He has DOE that is chronic. He is trying to get disability. He recently underwent coronary CTA which showed a high grade lesion in the RCA..        ROS:  See HPI, A twelve system review was perfomed at today's visit. For pertinent positives and negatives see HPI.       Medical History: COPD with inhalation of toxic gas in 1995, Rheumatic fever, abnormal cardiac CTA with high grade RCA lesion.        Surgical History: tonsillectomy , vasectomy .        Family History: Father: deceased brain aneurysm with CVA Mother: alive cancer Brother 1: deceased colon CA        Social History:  General:  no Alcohol, alcoholic all his life but quit 03/2009.  Caffeine: yes, 2+ servings daily, coffee.  Marital Status: single, Separated.  Children: 4 children.         Medications: Advair Diskus 500-50 MCG/DOSE Miscellaneous 1 puff every 12 hrs, Albuterol Sulfate HFA 108 (90 Base) MCG/ACT Aerosol Solution 2 puffs as needed every 4 hrs, Benadryl 25 MG Capsule 1 capsule daily, Mucinex 600 MG Tablet Extended Release 12 Hour 1 tablet for chest congestion every 12 hrs, Tamsulosin HCl 0.4 MG Capsule 1 capsule 30 minutes after the same meal each day Once a day, Tramadol HCl 50 MG Tablet Soluble 1 tablet twice a day as needed, Indomethacin 25 MG Capsule 1 capsule with food Twice a day as needed, Ranitidine HCl 150 MG Tablet 1 tablet Twice a day, Pantoprazole Sodium 40 MG Tablet Delayed Release 1 tablet Once a day, Cyclobenzaprine HCl 10 MG Tablet 1 tablet at bedtime as needed, Medication List reviewed and reconciled with the patient       Allergies: Preservatives: swelling of throat: Allergy.       Objective:     Vitals: Wt 239, Wt change 2 lb, Ht 71, BMI 33.33, Pulse sitting 68, BP sitting 128/88.       Examination:  Cardiology, General:  GENERAL APPEARANCE: pleasant, NAD.  HEENT: unremarkable.  CAROTID UPSTROKE: normal, no bruit.  JVD: flat.  HEART SOUNDS: regular, normal S1, S2, no S3 or   S4.  MURMUR: absent.  LUNGS: no rales or wheezes.  ABDOMEN: soft, non tender, positive bowel sounds, no masses felt.  EXTREMITIES: no leg edema.  PERIPHERAL PULSES: 2 plus bilateral.        Assessment:     Assessment:  1. SOB (shortness of breath) - 786.05 (Primary)  2. CAD in native artery - 414.01  3. COPD (chronic obstructive pulmonary disease) - 496  4. Abnormal cardiovascular function study - 794.30    Plan:     1. CAD in native artery  LAB: PT (Prothrombin Time) (005199) (Ordered for 01/05/2012) Normal    Prothrombin Time 10.2 9.1-12.0 - SEC    INR 0.9 0.8-1.2 -     Demoni Parmar M 01/08/2012 08:14:59 AM > for cath    LAB: Basic Metabolic (Ordered for 01/05/2012) Stable    GLUCOSE 87 70-99 - mg/dL    BUN 13 6-26 - mg/dL    CREATININE 1.09  0.60-1.30 - mg/dl    eGFR (NON-AFRICAN AMERICAN) 71 >60 - calc    eGFR (AFRICAN AMERICAN) 86 >60 - calc    SODIUM 139 136-145 - mmol/L    POTASSIUM 4.5 3.5-5.5 - mmol/L    CHLORIDE 109 98-107 - mmol/L H   C02 21 22-32 - mg/dL L   ANION GAP 13.1 6.0-20.0 - mmol/L    CALCIUM 9.3 8.6-10.3 - mg/dL     Lilac Hoff M 01/08/2012 08:14:32 AM > pleae forward to priamry MD    LAB: CBC with Diff (Ordered for 01/05/2012) Normal    WBC 7.3 4.0-11.0 - K/ul    RBC 4.91 4.20-5.80 - M/uL    HGB 15.2 13.0-17.0 - g/dL    HCT 44.4 39.0-52.0 - %    MCH 30.9 27.0-33.0 - pg    MPV 9.1 7.5-10.7 - fL    MCV 90.5 80.0-94.0 - fL    MCHC 34.2 32.0-36.0 - g/dL    RDW 13.3 11.5-15.5 - %    PLT 140 150-400 - K/uL L   NEUT % 56.4 43.3-71.9 - %    LYMPH% 31.3 16.8-43.5 - %    MONO % 8.4 4.6-12.4 - %    EOS % 3.0 0.0-7.8 - %    BASO % 0.9 0.0-1.0 - %    NEUT # 4.1 1.9-7.2 - K/uL    LYMPH# 2.30 1.10-2.70 - K/uL    MONO # 0.6 0.3-0.8 - K/uL    EOS # 0.2 0.0-0.6 - K/uL    BASO # 0.1 0.0-0.1 - K/uL     Kalen Neidert M 01/05/2012 03:04:33 PM > Harward,Amy 01/05/2012 04:40:23 PM > noted for cath, will fax to cath lab.    LAB: PT (Prothrombin Time) (005199) (Ordered for 01/05/2012) Normal    Prothrombin Time 10.2 9.1-12.0 - SEC    INR 0.9 0.8-1.2 -     Beverlie Kurihara M 01/08/2012 08:14:59 AM > for cath    LAB: Basic Metabolic (Ordered for 01/05/2012) Stable    GLUCOSE 87 70-99 - mg/dL    BUN 13 6-26 - mg/dL    CREATININE 1.09 0.60-1.30 - mg/dl    eGFR (NON-AFRICAN AMERICAN) 71 >60 - calc    eGFR (AFRICAN AMERICAN) 86 >60 - calc    SODIUM 139 136-145 - mmol/L    POTASSIUM 4.5 3.5-5.5 - mmol/L    CHLORIDE 109 98-107 - mmol/L H   C02 21 22-32 - mg/dL L   ANION GAP 13.1 6.0-20.0 - mmol/L    CALCIUM 9.3 8.6-10.3 - mg/dL     Nivedita Mirabella M   01/08/2012 08:14:32 AM > pleae forward to priamry MD    LAB: CBC with Diff (Ordered for 01/05/2012) Normal    WBC 7.3 4.0-11.0 - K/ul    RBC 4.91 4.20-5.80 - M/uL    HGB  15.2 13.0-17.0 - g/dL    HCT 44.4 39.0-52.0 - %    MCH 30.9 27.0-33.0 - pg    MPV 9.1 7.5-10.7 - fL    MCV 90.5 80.0-94.0 - fL    MCHC 34.2 32.0-36.0 - g/dL    RDW 13.3 11.5-15.5 - %    PLT 140 150-400 - K/uL L   NEUT % 56.4 43.3-71.9 - %    LYMPH% 31.3 16.8-43.5 - %    MONO % 8.4 4.6-12.4 - %    EOS % 3.0 0.0-7.8 - %    BASO % 0.9 0.0-1.0 - %    NEUT # 4.1 1.9-7.2 - K/uL    LYMPH# 2.30 1.10-2.70 - K/uL    MONO # 0.6 0.3-0.8 - K/uL    EOS # 0.2 0.0-0.6 - K/uL    BASO # 0.1 0.0-0.1 - K/uL     Shamarra Warda M 01/05/2012 03:04:33 PM > Harward,Amy 01/05/2012 04:40:23 PM > noted for cath, will fax to cath lab.    Diagnostic Imaging:Cardiac Cath (Ordered for 01/12/2012)  I have reviewed the findings of the cardiac coronary CTA with the patient. I thinks his SOB is probably from the RCA lesion. I will set him up for cath next Friday in the main cath lab when Dr. Smith is available for PCI., Risks and benefits of cardiac catheterization have been reviewed including risk of stroke, heart attack, death, bleeding, renal impariment and arterial damage. There was ample oppurtuny to answer questions. Alternatives were discussed. Patient understands and wishes to proceed.        Immunizations:        Labs:        Procedure Codes: 36415 BLOOD COLLECTION ROUTINE VENIPUNCTURE, 80048 ECL BMP, 85025 ECL CBC PLATELET DIFF       Preventive:         Follow Up: cTAH      Provider: Mckenize Mezera, MD  Patient: John Forbes, John Forbes DOB: 03/03/1961 Date: 01/05/2012     

## 2012-01-12 NOTE — Brief Op Note (Signed)
01/12/2012  9:57 AM  PATIENT:  John Forbes  51 y.o. male  PRE-OPERATIVE DIAGNOSIS:  Chest pain  POST-OPERATIVE DIAGNOSIS:  * No post-op diagnosis entered *  PROCEDURE:  Procedure(s): LEFT HEART CATHETERIZATION WITH CORONARY ANGIOGRAM PERCUTANEOUS CORONARY STENT INTERVENTION (PCI-S)  SURGEON:  Surgeon(s): Quintella Reichert, MD Lesleigh Noe, MD  PHYSICIAN ASSISTANT: none  ASSISTANTS: Tammy  ANESTHESIA:   IV sedation  RESULT: successful DES PDA and FFR mid RCA.

## 2012-01-12 NOTE — Interval H&P Note (Signed)
History and Physical Interval Note:  01/12/2012 7:43 AM  John Forbes  has presented today for surgery, with the diagnosis of Chest pain  The various methods of treatment have been discussed with the patient and family. After consideration of risks, benefits and other options for treatment, the patient has consented to  Procedure(s): LEFT HEART CATHETERIZATION WITH CORONARY ANGIOGRAM as a surgical intervention .  The patients' history has been reviewed, patient examined, no change in status, stable for surgery.  I have reviewed the patients' chart and labs.  Questions were answered to the patient's satisfaction.     Lasonya Hubner R

## 2012-01-12 NOTE — Op Note (Signed)
   PERCUTANEOUS CORONARY INTERVENTION   John Forbes is a 51 y.o. male  INDICATION:  Class III angina, abnormal coronary CTA, abnormal coronary angiogram with indeterminate mid lesion and high-grade mid PDA stenosis. The culprit is felt to be the PDA stenosis.   PROCEDURE: 1. DES mid PDA.  2. FFR mid RCA intermediate stenosis   CONSENT: The risks, benefits, and details of the procedure were explained to the patient. Risks including death, MI, stroke, bleeding, limb ischemia, renal failure and allergy were described and accepted by the patient.  Informed written consent was obtained prior to proceeding.  PROCEDURE TECHNIQUE:  After Xylocaine anesthesia a 5 French diagnostic sheath was exchanged out for a 6 French sheath in the right femoral artery..   Coronary guiding shots were made using a FR4 guiding catheter. Antithrombotic therapy, Angiomax, was begun and determined to be therapeutic by ACT. Antiplatelet therapy, Brilinta, was loaded.  PCI on the PDA was then performed over a 0.014 BMW wire. Predilatation with a 2.25 mm/12 mm long apex balloon was then performed. A 2-5 x 16 mm Thomas element stent was then positioned and deployed at 13 atmospheres. An in C. Quantum apex 2.5 x 12 mm postmeal balloon was then used and high-pressure dilatation to 14 atmospheres performed. The final angiographic result was acceptable.   We then turned our attention to the intermediate stenosis in the mid RCA. A volcano FFR wire was placed beyond the mid stenosis. A Dennison infusion was started and continued for 180 seconds. The FFR was 0.84. This demonstrated a nonhemodynamically significant lesion. PCI was not performed at this site.  After angiographic demonstration of the femoral access site, Angio-Seal was performed with good hemostasis.  CONTRAST:  Total 110 cc.  COMPLICATIONS:  None.    ANGIOGRAPHIC RESULTS:   1. PDA 95% stenosis decreased to 0% with TIMI grade 3 flow.  2. Mid RCA 50-70%  stenosis demonstrated to be hemodynamically insignificant by FFR 0.84.   IMPRESSIONS:  Successful DES mid PDA from 95% to 0% with TIMI grade 3 flow.   RECOMMENDATION:  Aggressive risk factor modification including statin therapy, and smoking cessation. Brilinta and aspirin no antiplatelet therapy for one year. We could switch to generic clopidogrel after one month of Brilinta. Further management per Dr. Armanda Magic.    Lesleigh Noe, MD 01/12/2012 9:59 AM

## 2012-01-12 NOTE — Progress Notes (Signed)
Tele showed 15 bts V-tach.  RN ran to room and pt returned to SR 80's as soon as I entered room.  Pt reclining in bed, leaning on left arm.  BP 139/81.  States aware of heart racing, happens at home usually when leaning toward left side or bending head to side.   Dr Anne Fu informed.  Orders rec'd, started on lopressor.  Further discussion found that pt has large knowledge deficit.   Pt very poor historian and difficult to extract precise information on what he's had done before. States he has passed out several times.  Asked pt not to get OOB w/o assist due to high fall risk.   Pt still does not understand importance of taking blood thinner for stent despite earlier teaching.  Pt also felt strongly about not taking tylenol because it "gave his brother cancer" but yet insists smoking is not harmful for him.  Close monitoring continues.

## 2012-01-12 NOTE — Progress Notes (Signed)
Pt smokes 1/2 ppd and despite discussing in detail the risk factors of smoking and how it relates to his heart health pt says he doesn't believe smoking has any part in his CAD. Advised and encouraged pt to quit. Referred to 1-800 quit now for f/u and support. Discussed oral fixation substitutes, second hand smoke and in home smoking policy. Reviewed and gave pt Written education/contact information.

## 2012-01-12 NOTE — Progress Notes (Signed)
   CARE MANAGEMENT NOTE 01/12/2012  Patient:  Kann,Ankur A   Account Number:  [id number]  Date Initiated:  01/12/2012  Documentation initiated by:  GRAVES-BIGELOW,Lakai Moree  Subjective/Objective Assessment:   Pt in with cp. [facility] and was unable to see if can get brilinta filled there. CM did call [facility] and he can pick up 30 day supply there. He is aware.     Action/Plan:   Patient assist forms in chart  for MD to fill. If he can not purchase at [facility] he will need to send in forms.   Anticipated DC Date:  01/13/2012   Anticipated DC Plan:  HOME/SELF CARE      DC Planning Services  CM consult      Choice offered to / List presented to:             Status of service:  Completed, signed off Medicare Important Message given?   (If response is "NO", the following Medicare IM given date fields will be blank) Date Medicare IM given:   Date Additional Medicare IM given:    Discharge Disposition:  HOME/SELF CARE  Per UR Regulation:    Comments:  Please write rx for 30 day free and no refills along with original with refills. pt assist forms in chart under d/c section. Thanks

## 2012-01-12 NOTE — Op Note (Signed)
PROCEDURE:  Left heart catheterization with selective coronary angiography, left ventriculogram.  INDICATIONS:  Chest pain and obstructive RCA ASCAD by coronary CTA  The risks, benefits, and details of the procedure were explained to the patient.  The patient verbalized understanding and wanted to proceed.  Informed written consent was obtained.  PROCEDURE TECHNIQUE:  After Xylocaine anesthesia a 29F sheath was placed in the right femoral artery with a single anterior needle wall stick.   Left coronary angiography was done using a Judkins L4 guide catheter.  Right coronary angiography was done using a Judkins R4 guide catheter.  Left ventriculography was done using a pigtail catheter.    CONTRAST:  Total of 70 cc.  COMPLICATIONS:  None.    HEMODYNAMICS:  Aortic pressure was 112/55mmHg; LV pressure was 117/48mmHg; LVEDP .  There was no gradient between the left ventricle and aorta.    ANGIOGRAPHIC DATA:   The left main coronary artery is short and widely patent.  It bifurcates into an LAD and left circumflex.  The left anterior descending artery is widely patent throughout it course.  It gives rise to a large first diagonal which is widely patent and a second diagonal which is moderate in size and widely patent.  The left circumflex artery is widely patent through its course in the AV groove.  It gives rise to a large OM1 which is widely patent.  It bifurcates into 2 large daughter branches both of which are widely patent.  The ongoing left circumflex is patent.  The right coronary artery is widely patent in its proximal portion.  It gives rise to a small acute RV marginal branch.  After the takeoff of the RV marginal there is a 40-50% narrowing in the mid RCA.  The distal RCA has a 20-30% narrowing before bifurcating into  PDA and PL branches.  The PDA has and 70- 80% narrowing in the mid portion of the vessel.  The posterolateral branch is widely patent.  LEFT VENTRICULOGRAM:  Left  ventricular angiogram was done in the 30 RAO projection and revealed normal left ventricular wall motion and systolic function with an estimated ejection fraction of 55%.  LVEDP was 9 mmHg.  IMPRESSIONS:  1. Normal left main coronary artery. 2. Normal left anterior descending artery and its branches. 3. Normal left circumflex artery and its branches. 4. 40-50% mid RCA stenosis, 70-80% mid PDA stenosis 5. Normal left ventricular systolic function.  LVEDP 9 mmHg.  Ejection fraction 55%.  RECOMMENDATION:  Films reviewed with Dr.Smith.  Will proceed with PCI of PDA and flow wire of mid RCA.  Start Brilanta and ASA.  Smoking cessation.

## 2012-01-13 ENCOUNTER — Other Ambulatory Visit: Payer: Self-pay

## 2012-01-13 DIAGNOSIS — I2510 Atherosclerotic heart disease of native coronary artery without angina pectoris: Secondary | ICD-10-CM | POA: Diagnosis present

## 2012-01-13 LAB — BASIC METABOLIC PANEL
BUN: 12 mg/dL (ref 6–23)
CO2: 24 mEq/L (ref 19–32)
Chloride: 108 mEq/L (ref 96–112)
Creatinine, Ser: 1.02 mg/dL (ref 0.50–1.35)
Glucose, Bld: 107 mg/dL — ABNORMAL HIGH (ref 70–99)

## 2012-01-13 LAB — CBC
HCT: 42.3 % (ref 39.0–52.0)
Hemoglobin: 14.7 g/dL (ref 13.0–17.0)
MCV: 89.4 fL (ref 78.0–100.0)
RBC: 4.73 MIL/uL (ref 4.22–5.81)
WBC: 8.1 10*3/uL (ref 4.0–10.5)

## 2012-01-13 MED ORDER — METOPROLOL TARTRATE 25 MG PO TABS: 25.0000 mg | ORAL_TABLET | Freq: Two times a day (BID) | ORAL | Status: DC

## 2012-01-13 NOTE — Discharge Summary (Signed)
Patient ID: John Forbes MRN: [id number] DOB/AGE: Sep 28, 1961 51 y.o.  Admit date: 01/12/2012 Discharge date: 01/13/2012  Primary Discharge Diagnosis CAD-DES to PDA Secondary Discharge Diagnosis COPD  Hospital Course: CATH 01/11/12 - DES to PDA 2.5 x 16mm. FFR done on mid RCA 0.84, not hemodynamically significant.   Post cath, angioseal. Small hematoma, no bruit, 2+ distal pulses. CTAB, RRR.   Yesterday evening, had 15 beat run of VT at about 140-150 bpm. Felt palpitations. Says has felt similar palpitations at home (when flipping his hair back for instance). No syncope. Metoprolol 25bid started.   No CP this am. "Feels better" Ambulating well.   Discharge Exam: Blood pressure 99/60, pulse 73, temperature 98 F (36.7 C), temperature source Oral, resp. rate 15, height 5\' 11"  (1.803 m), weight 104.1 kg (229 lb 8 oz), SpO2 91.00%.    Labs:   Lab Results  Component Value Date   WBC 8.1 01/13/2012   HGB 14.7 01/13/2012   HCT 42.3 01/13/2012   MCV 89.4 01/13/2012   PLT 142* 01/13/2012    No results found for this basename: CKTOTAL, CKMB, CKMBINDEX, TROPONINI    Lab Results  Component Value Date   CHOL 162 10/20/2009   Lab Results  Component Value Date   HDL 39* 10/20/2009   Lab Results  Component Value Date   LDLCALC 97 10/20/2009   Lab Results  Component Value Date   TRIG 132 10/20/2009   Lab Results  Component Value Date   CHOLHDL 4.2 Ratio 10/20/2009   No results found for this basename: LDLDIRECT      Tele personally reviewed as above  FOLLOW UP PLANS AND APPOINTMENTS Discharge Orders    Future Appointments: Provider: Department: Dept Phone: Center:   01/30/2012 3:15 PM Kalman Shan, MD [facility] [phone] None     Future Orders Please Complete By Expires   Diet - low sodium heart healthy      Diet - low sodium heart healthy      Increase activity slowly      Increase activity slowly        Medication List  As of 01/13/2012  7:51 AM   TAKE these  medications         albuterol 108 (90 BASE) MCG/ACT inhaler   Commonly known as: PROVENTIL HFA;VENTOLIN HFA   Inhale 2 puffs into the lungs every 6 (six) hours as needed. For shortness of breath      aspirin EC 325 MG tablet   Take 1 tablet (325 mg total) by mouth daily.      cyclobenzaprine 10 MG tablet   Commonly known as: FLEXERIL   Take 10 mg by mouth 3 (three) times daily as needed. For muscle spasms      diphenhydrAMINE 25 MG tablet   Commonly known as: BENADRYL   Take 25 mg by mouth daily.      guaiFENesin 600 MG 12 hr tablet   Commonly known as: MUCINEX   Take 600 mg by mouth 2 (two) times daily as needed.      indomethacin 25 MG capsule   Commonly known as: INDOCIN   Take 25 mg by mouth 2 (two) times daily as needed.      metoprolol tartrate 25 MG tablet   Commonly known as: LOPRESSOR   Take 1 tablet (25 mg total) by mouth 2 (two) times daily.      nitroGLYCERIN 0.4 MG SL tablet   Commonly known as: NITROSTAT   Place 1 tablet (  0.4 mg total) under the tongue every 5 (five) minutes as needed for chest pain.      pantoprazole 40 MG tablet   Commonly known as: PROTONIX   Take 40 mg by mouth daily.      ranitidine 150 MG tablet   Commonly known as: ZANTAC   Take 150 mg by mouth 2 (two) times daily.      rosuvastatin 40 MG tablet   Commonly known as: CRESTOR   Take 1 tablet (40 mg total) by mouth daily at 6 PM.      Ticagrelor 90 MG Tabs tablet   Commonly known as: BRILINTA   Take 1 tablet (90 mg total) by mouth 2 (two) times daily.      tiotropium 18 MCG inhalation capsule   Commonly known as: SPIRIVA   Place 18 mcg into inhaler and inhale daily.      traMADol 50 MG tablet   Commonly known as: ULTRAM   Take 50 mg by mouth 2 (two) times daily as needed. Maximum dose= 8 tablets per day  For pain           Follow-up Information    Follow up with FERGUSON,CYNTHIA A, NP on 01/26/2012. (at 10:15am)    Contact information:   [facility] [address], [address] [place] [address] [phone]          BRING ALL MEDICATIONS WITH YOU TO FOLLOW UP APPOINTMENTS  Time spent with patient to include physician time: . Prescriptions were printed and signed.   SKAINS, MARK 01/13/2012, 7:51 AM

## 2012-01-13 NOTE — Progress Notes (Addendum)
CARDIAC REHAB PHASE I   PRE:  Rate/Rhythm: Sinus Rhythm 79  BP:  Supine: 118/70     SaO2:  95 Room Air  MODE:  Ambulation: 450 ft  POST:  Rate/Rhythem: Sinus Rhythm 79  BP:    Sitting: 128/87     SaO2: 97% Room air Cardiac rehab phase 1 1610-9604.   Order appreciated.  Patient ambulated 450 feet in hallway using his cane without complaints of chest pain or shortness of breath.  No ectopy noted during ambulation.  Patient tolerated well.  Discharge instructions reviewed with patient. Talked with Mr Kolbe regarding outpatient cardiac rehab.  Mr Cambre declined due to finances.  I offered to give Mr Liaw an application for financial assistance, he declined that as well.  Mr Knebel says he plans to continue to smoke although he has been advised not to.  Harlon Flor, Arta Bruce

## 2012-01-15 MED FILL — Dextrose Inj 5%: INTRAVENOUS | Qty: 50 | Status: AC

## 2012-01-15 MED FILL — Bivalirudin Trifluoroacetate For IV Soln 250 MG (Base Equiv): INTRAVENOUS | Qty: 250 | Status: AC

## 2012-01-15 NOTE — Progress Notes (Signed)
   CARE MANAGEMENT NOTE 01/15/2012  Patient:  Gailey,Amyr A   Account Number:  [id number]  Date Initiated:  01/12/2012  Documentation initiated by:  GRAVES-BIGELOW,Luis Nickles  Subjective/Objective Assessment:   Pt in with cp. [facility] and was unable to see if can get brilinta filled there. CM did call [facility] and he can pick up 30 day supply there. He is aware.     Action/Plan:   Patient assist forms in chart  for MD to fill. If he can not purchase at [facility] he will need to send in forms.   Anticipated DC Date:  01/13/2012   Anticipated DC Plan:  HOME/SELF CARE      DC Planning Services  CM consult      Choice offered to / List presented to:             Status of service:  Completed, signed off Medicare Important Message given?   (If response is "NO", the following Medicare IM given date fields will be blank) Date Medicare IM given:   Date Additional Medicare IM given:    Discharge Disposition:  HOME/SELF CARE  Per UR Regulation:    Comments:  01-15-12 1221 Tomi Bamberger, RN,BSN [phone] CM did f/u and called pt to see if he was able to get medications from [facility] and he was unable to after d/c date. Pt stated he missed his Sunday doseage and then CM stated that you will need to call office to get samples if you are unable to get to [facility]. [facility] to see if they are able to dispense brilinta medicaiton and they do not. Pt will have to send az & me into company to see if he can qualify for assistance. CM did place call to MD [facility] to make him aware and he asked me to call MD [facility] to make her aware of missed medicaiton. CM did do so, however was disconnected and had to leave a message with front desk.  01-12-12 1430 Tomi Bamberger, [place] [phone] Please write rx for 30 day free and no refills along with original with refills. pt assist  forms in chart under d/c section. Thanks

## 2012-01-30 ENCOUNTER — Ambulatory Visit (INDEPENDENT_AMBULATORY_CARE_PROVIDER_SITE_OTHER): Payer: Self-pay | Admitting: Internal Medicine

## 2012-01-30 ENCOUNTER — Encounter: Payer: Self-pay | Admitting: Internal Medicine

## 2012-01-30 VITALS — BP 128/80 | HR 71 | Temp 98.4°F | Ht 71.0 in | Wt 233.4 lb

## 2012-01-30 DIAGNOSIS — J449 Chronic obstructive pulmonary disease, unspecified: Secondary | ICD-10-CM

## 2012-01-30 DIAGNOSIS — R0602 Shortness of breath: Secondary | ICD-10-CM

## 2012-01-30 DIAGNOSIS — F17200 Nicotine dependence, unspecified, uncomplicated: Secondary | ICD-10-CM

## 2012-01-30 NOTE — Progress Notes (Signed)
Subjective:    Patient ID: John Forbes, male    DOB: 12-17-1961, 51 y.o.   MRN: [id number]  HPI Smoker who quit May 2011. Has class 2 dyspnea. Started in 1995 when he inhaled ammonia gas following an exposure. Cardiac stress test negative feb 2011 . Echo jan 2011 suggested RV dysfn. Also, he is trying to apply for [place] disability   OV 08/23/2010: Last visit was in June 2011; the first visit. PFts done while on advair showed isolated reduction in DLCO. So, he underwent CT chestin June 2011. This showe diffuse emphysema. However, due to Disability appliocation and symptoms being out of proprition to degree of PFT abnormality we did a CPST. He underwent first CPST on 06/22/2010. There was question of submaximal effort. So, we repeatd it on 08/16/2010 with pre- and peak- ABG. Results in both tests are very similar. Effort good based on abg. Spirometry suggested mild-moderate copd both occassions. He was limited due to ventilatory reasons with high dead space ventilation c/w COPD. However, Vo2 max was consistently > 83ml/kg/min. Therefore, not a candidate for permanent disability. In terms of symptoms he is now on spiriva and reporting improved dyspnea and cough. Only new issue is that he is c/opNEW right wrist pain following ABG. It is constant and dull and gets worse when he extends arm out. Sudden onset following abg stick on 08/16/2010. Slowly improving. No associated swelling or weakness.    February 20, 2011: Followup Gold stage 1 cOPD and tobacco abuse, DISAbility app. Still continuing spiriva. Refused to attend rehab due to $ issues. Has relapsed into smoking again - lot of pscyhosocial stress at home and $ issues. Stil wiht Class 2 dyspnea - 2 blocks. No change in baseline mild smoker's cough. No new issues either. After walking 155 feet x 3 laps in our office today he DID NOT desaturate. Of note, he is having atypical chest pain. States he had cardiac stress test and has been cleared by [facility] cardiology     Patient Instructions:  1) #COPD  2) - please continue spiriva  3) - will call you with genetic test result  4) #SHORTNESS OF BREATH  5) - this is due to the copd but also you being out of shape  6) - rehab will really help - let me know when you ar ready  7) #SMOKING  8) - please work on quitting  9) #FOLLOOWUP  10) - 9 months  OV 11/28/2011 FU dyspnea, copd stage 1, tobacco abuse. States class 2-3 exertional dyspnea no better.,He is still smoking. Cannot quit. Refuses to go to rehab. Tough situation - unwillng to quit smoking, unwilling to attend rehab and chronic pain issues with possible secondary gain.  Cough only mild and baseline. Overall unchanged health. Has constant atypical chest pain. He feels it is cardiac. He says cards cleared him but he feels they were wrong. I was able to review his nuc med stress test from feb 2011 which is avail f to me and it says  Left ventricular ejection fraction: Calculated left ventricular ejection fraction = 49%  With  No reversible ischemia,  Mild inferior wall hypokinesis,  Left ventricular ejection fraction equal 49 %  REC Please visit with Dr Armanda Magic cardiology and readdress question of heart causing shortness of breath  Your shortness of breath is some from copd and some from being out of shape but we need to make sure there are no heart issues  I will see you back in  4-6 months  OV 01/30/2012   Followup copd on basis of CT emphysema and isolated low dlco, smoking,  and out of proportion dyspnea. In interim saw Dr Mayford Knife 01/12/12: and noted to have EF 55% and  50% mid RCA stenosis, 70-80% mid PDA stenosis and is now s/p  PCI of PDA and flow wire of mid RCA. Start Brilanta and ASA. Since then chest pain significantly improved. Dyspnea improved but only somewhat however he says he has not challenged himself. Walking from our office to parking lot still making him dyspneic but says overall able to take bigger better breath. For COPD taking  only spiriva (not doing symbicort due to GERD). Still smoking. Still refusing to go to rehab due to cost issues and logistics  Walking desat today p 3 laps x 185 fet - lowes pulse ox 93% Spirometry today: Fev1 3.44L/84%, RAtio 54 (on spiriva) and similar to 2011 PFT which only showed isolated low dlco      Review of Systems  Constitutional: Negative for fever and unexpected weight change.  HENT: Negative for ear pain, nosebleeds, congestion, sore throat, rhinorrhea, sneezing, trouble swallowing, dental problem, postnasal drip and sinus pressure.   Eyes: Negative for redness and itching.  Respiratory: Negative for cough, chest tightness, shortness of breath and wheezing.   Cardiovascular: Negative for palpitations and leg swelling.  Gastrointestinal: Negative for nausea and vomiting.  Genitourinary: Negative for dysuria.  Musculoskeletal: Negative for joint swelling.  Skin: Negative for rash.  Neurological: Negative for headaches.  Hematological: Does not bruise/bleed easily.  Psychiatric/Behavioral: Negative for dysphoric mood. The patient is not nervous/anxious.        Objective:   Physical Exam General: well developed, well nourished, in no acute distress  Head: normocephalic and atraumatic  Eyes: PERRLA/EOM intact; conjunctiva and sclera clear  Ears: TMs intact and clear with normal canals  Nose: no deformity, discharge, inflammation, or lesions  Mouth: no deformity or lesions  Neck: no masses, thyromegaly, or abnormal cervical nodes  Chest Wall: no deformities noted  Lungs: clear bilaterally to auscultation and percussion  Heart: regular rate and rhythm, S1, S2 without murmurs, rubs, gallops, or clicks  Abdomen: bowel sounds positive; abdomen soft and non-tender without masses, or organomegaly  Msk: no deformity or scoliosis noted with normal posture  Pulses: pulses normal  Extremities: no clubbing, cyanosis, edema, or deformity noted  RT forearm, wrist and hand - looks  normal. No sweling. Normal range of motion. Normal pulses. Normal sensation. Normal strenght. He is tender when radial artery is palpated but no deficits  Neurologic: CN II-XII grossly intact with normal reflexes, coordination, muscle strength and tone  Skin: intact  tatoos +  Cervical Nodes: no significant adenopathy  Axillary Nodes: no significant adenopathy  Psych: alert and cooperative; normal mood and affect; normal attention span and concentration          Assessment & Plan:

## 2012-01-30 NOTE — Patient Instructions (Addendum)
Your shortness of breath is combination of lung, heart and fitness issues Your breathing test is normal and similar to 2011 If you continue to smoke there is risk this will get worse, so far you are lucky from lung stand point Continue spiriva Quit smoking I would encourage you to attend rehab when you can Please return to office when shortness of breath worsens

## 2012-02-01 ENCOUNTER — Encounter: Payer: Self-pay | Admitting: Internal Medicine

## 2012-02-01 NOTE — Assessment & Plan Note (Signed)
Stable disease. Dyspnea is out of proportion  Plan Continue spiriva Offered rehab but he refused Quit smoking Prn followup

## 2012-02-01 NOTE — Assessment & Plan Note (Signed)
Your shortness of breath is combination of lung, heart and fitness issues Your breathing test is normal and similar to 2011 If you continue to smoke there is risk this will get worse, so far you are lucky from lung stand point Continue spiriva Quit smoking I would encourage you to attend rehab when you can Please return to office when shortness of breath worsens - prn followup

## 2012-02-01 NOTE — Assessment & Plan Note (Signed)
Advised to quit.  

## 2013-06-26 ENCOUNTER — Emergency Department (HOSPITAL_COMMUNITY): Payer: Self-pay

## 2013-06-26 ENCOUNTER — Inpatient Hospital Stay (HOSPITAL_COMMUNITY)
Admission: EM | Admit: 2013-06-26 | Discharge: 2013-06-27 | DRG: 287 | Disposition: A | Discharge status: Home / Self Care | Payer: No Typology Code available for payment source | Attending: Cardiology | Admitting: Cardiology

## 2013-06-26 ENCOUNTER — Encounter (HOSPITAL_COMMUNITY): Payer: Self-pay | Admitting: *Deleted

## 2013-06-26 ENCOUNTER — Encounter (HOSPITAL_COMMUNITY)
Admission: EM | Disposition: A | Discharge status: Home / Self Care | Payer: Self-pay | Source: Home / Self Care | Attending: Interventional Cardiology

## 2013-06-26 DIAGNOSIS — I200 Unstable angina: Secondary | ICD-10-CM

## 2013-06-26 DIAGNOSIS — Z91199 Patient's noncompliance with other medical treatment and regimen due to unspecified reason: Secondary | ICD-10-CM

## 2013-06-26 DIAGNOSIS — Z9119 Patient's noncompliance with other medical treatment and regimen: Secondary | ICD-10-CM

## 2013-06-26 DIAGNOSIS — J4489 Other specified chronic obstructive pulmonary disease: Secondary | ICD-10-CM | POA: Diagnosis present

## 2013-06-26 DIAGNOSIS — K449 Diaphragmatic hernia without obstruction or gangrene: Secondary | ICD-10-CM | POA: Diagnosis present

## 2013-06-26 DIAGNOSIS — M545 Low back pain, unspecified: Secondary | ICD-10-CM | POA: Diagnosis present

## 2013-06-26 DIAGNOSIS — K219 Gastro-esophageal reflux disease without esophagitis: Secondary | ICD-10-CM | POA: Diagnosis present

## 2013-06-26 DIAGNOSIS — I00 Rheumatic fever without heart involvement: Secondary | ICD-10-CM | POA: Diagnosis present

## 2013-06-26 DIAGNOSIS — I2510 Atherosclerotic heart disease of native coronary artery without angina pectoris: Secondary | ICD-10-CM | POA: Diagnosis present

## 2013-06-26 DIAGNOSIS — Z9861 Coronary angioplasty status: Secondary | ICD-10-CM

## 2013-06-26 DIAGNOSIS — J449 Chronic obstructive pulmonary disease, unspecified: Secondary | ICD-10-CM | POA: Diagnosis present

## 2013-06-26 DIAGNOSIS — G8929 Other chronic pain: Secondary | ICD-10-CM | POA: Diagnosis present

## 2013-06-26 DIAGNOSIS — Z7982 Long term (current) use of aspirin: Secondary | ICD-10-CM

## 2013-06-26 DIAGNOSIS — R079 Chest pain, unspecified: Secondary | ICD-10-CM

## 2013-06-26 DIAGNOSIS — Y849 Medical procedure, unspecified as the cause of abnormal reaction of the patient, or of later complication, without mention of misadventure at the time of the procedure: Secondary | ICD-10-CM | POA: Diagnosis present

## 2013-06-26 DIAGNOSIS — D126 Benign neoplasm of colon, unspecified: Secondary | ICD-10-CM | POA: Diagnosis present

## 2013-06-26 DIAGNOSIS — F17200 Nicotine dependence, unspecified, uncomplicated: Secondary | ICD-10-CM | POA: Diagnosis present

## 2013-06-26 HISTORY — PX: LEFT HEART CATHETERIZATION WITH CORONARY ANGIOGRAM: SHX5451

## 2013-06-26 LAB — POCT ACTIVATED CLOTTING TIME
Activated Clotting Time: 227 seconds
Activated Clotting Time: 365 seconds

## 2013-06-26 LAB — BASIC METABOLIC PANEL
BUN: 18 mg/dL (ref 6–23)
BUN: 14 mg/dL (ref 6–23)
CO2: 25 mEq/L (ref 19–32)
Calcium: 8.8 mg/dL (ref 8.4–10.5)
Chloride: 105 mEq/L (ref 96–112)
Chloride: 105 mEq/L (ref 96–112)
Creatinine, Ser: 1.09 mg/dL (ref 0.50–1.35)
GFR calc Af Amer: 88 mL/min — ABNORMAL LOW (ref >90)
GFR calc non Af Amer: 75 mL/min — ABNORMAL LOW (ref >90)
GFR calc non Af Amer: 76 mL/min — ABNORMAL LOW (ref >90)
GFR calc non Af Amer: >90 mL/min (ref >90)
Glucose, Bld: 107 mg/dL — ABNORMAL HIGH (ref 70–99)
Glucose, Bld: 103 mg/dL — ABNORMAL HIGH (ref 70–99)
Potassium: 3.8 mEq/L (ref 3.5–5.1)
Potassium: 4 mEq/L (ref 3.5–5.1)
Sodium: 138 mEq/L (ref 135–145)
Sodium: 140 mEq/L (ref 135–145)

## 2013-06-26 LAB — CBC WITH DIFFERENTIAL/PLATELET
Eosinophils Relative: 4 % (ref 0–5)
Lymphocytes Relative: 37 % (ref 12–46)
Lymphs Abs: 2.9 10*3/uL (ref 0.7–4.0)
MCV: 88.8 fL (ref 78.0–100.0)
Neutro Abs: 4.1 10*3/uL (ref 1.7–7.7)
Platelets: 121 10*3/uL — ABNORMAL LOW (ref 150–400)
RBC: 4.65 MIL/uL (ref 4.22–5.81)
WBC: 7.8 10*3/uL (ref 4.0–10.5)

## 2013-06-26 LAB — CBC
HCT: 42.4 % (ref 39.0–52.0)
HCT: 43.5 % (ref 39.0–52.0)
Hemoglobin: 14.8 g/dL (ref 13.0–17.0)
Hemoglobin: 14.7 g/dL (ref 13.0–17.0)
MCHC: 34.9 g/dL (ref 30.0–36.0)
MCV: 88.8 fL (ref 78.0–100.0)
RBC: 4.8 MIL/uL (ref 4.22–5.81)
RBC: 4.9 MIL/uL (ref 4.22–5.81)
WBC: 7.3 10*3/uL (ref 4.0–10.5)

## 2013-06-26 LAB — POCT I-STAT TROPONIN I
Troponin i, poc: 0.01 ng/mL (ref 0.00–0.08)
Troponin i, poc: 0.01 ng/mL (ref 0.00–0.08)

## 2013-06-26 LAB — HEPARIN LEVEL (UNFRACTIONATED): Heparin Unfractionated: 0.41 IU/mL (ref 0.30–0.70)

## 2013-06-26 LAB — PROTIME-INR: INR: 0.91 (ref 0.00–1.49)

## 2013-06-26 LAB — MRSA PCR SCREENING: MRSA by PCR: NEGATIVE

## 2013-06-26 LAB — TROPONIN I: Troponin I: <0.3 ng/mL (ref <0.30)

## 2013-06-26 SURGERY — LEFT HEART CATHETERIZATION WITH CORONARY ANGIOGRAM
Anesthesia: LOCAL

## 2013-06-26 MED ORDER — SODIUM CHLORIDE 0.9 % IV SOLN: INTRAVENOUS | Status: DC

## 2013-06-26 MED ORDER — SODIUM CHLORIDE 0.9 % IJ SOLN: 3.0000 mL | Freq: Two times a day (BID) | INTRAMUSCULAR | Status: DC

## 2013-06-26 MED ORDER — ATORVASTATIN CALCIUM 40 MG PO TABS
40.0000 mg | ORAL_TABLET | Freq: Every day | ORAL | Status: DC
  Administered 2013-06-26: 23:00:00 40 mg via ORAL
  Filled 2013-06-26 (×3): qty 1

## 2013-06-26 MED ORDER — MIDAZOLAM HCL 2 MG/2ML IJ SOLN
INTRAMUSCULAR | Status: AC
  Filled 2013-06-26: qty 2

## 2013-06-26 MED ORDER — SODIUM CHLORIDE 0.9 % IV SOLN: 250.0000 mL | INTRAVENOUS | Status: DC | PRN

## 2013-06-26 MED ORDER — HEPARIN SODIUM (PORCINE) 1000 UNIT/ML IJ SOLN
INTRAMUSCULAR | Status: AC
  Filled 2013-06-26: qty 1

## 2013-06-26 MED ORDER — ACETAMINOPHEN 325 MG PO TABS: 650.0000 mg | ORAL_TABLET | ORAL | Status: DC | PRN

## 2013-06-26 MED ORDER — ASPIRIN 81 MG PO CHEW
324.0000 mg | CHEWABLE_TABLET | ORAL | Status: AC
  Administered 2013-06-26: 324 mg via ORAL
  Filled 2013-06-26: qty 4

## 2013-06-26 MED ORDER — ASPIRIN 81 MG PO CHEW: 81.0000 mg | CHEWABLE_TABLET | Freq: Every day | ORAL | Status: DC

## 2013-06-26 MED ORDER — TICAGRELOR 90 MG PO TABS
90.0000 mg | ORAL_TABLET | Freq: Two times a day (BID) | ORAL | Status: DC
  Administered 2013-06-26: 90 mg via ORAL
  Filled 2013-06-26 (×2): qty 1

## 2013-06-26 MED ORDER — TICAGRELOR 90 MG PO TABS
90.0000 mg | ORAL_TABLET | Freq: Two times a day (BID) | ORAL | Status: DC
  Administered 2013-06-26 – 2013-06-27 (×2): 90 mg via ORAL
  Filled 2013-06-26 (×3): qty 1

## 2013-06-26 MED ORDER — ASPIRIN 300 MG RE SUPP: 300.0000 mg | RECTAL | Status: DC

## 2013-06-26 MED ORDER — ONDANSETRON HCL 4 MG/2ML IJ SOLN: 4.0000 mg | Freq: Four times a day (QID) | INTRAMUSCULAR | Status: DC | PRN

## 2013-06-26 MED ORDER — NITROGLYCERIN 0.4 MG SL SUBL: 0.4000 mg | SUBLINGUAL_TABLET | SUBLINGUAL | Status: DC | PRN

## 2013-06-26 MED ORDER — NITROGLYCERIN 2 % TD OINT
0.5000 [in_us] | TOPICAL_OINTMENT | Freq: Once | TRANSDERMAL | Status: AC
  Administered 2013-06-26: 0.5 [in_us] via TOPICAL
  Filled 2013-06-26: qty 1

## 2013-06-26 MED ORDER — ASPIRIN 81 MG PO CHEW: 324.0000 mg | CHEWABLE_TABLET | ORAL | Status: DC

## 2013-06-26 MED ORDER — SODIUM CHLORIDE 0.9 % IV SOLN: INTRAVENOUS | Status: AC

## 2013-06-26 MED ORDER — ASPIRIN EC 81 MG PO TBEC
81.0000 mg | DELAYED_RELEASE_TABLET | Freq: Every day | ORAL | Status: DC
  Administered 2013-06-27: 10:00:00 81 mg via ORAL
  Filled 2013-06-26: qty 1

## 2013-06-26 MED ORDER — SODIUM CHLORIDE 0.9 % IJ SOLN: 3.0000 mL | INTRAMUSCULAR | Status: DC | PRN

## 2013-06-26 MED ORDER — HEPARIN (PORCINE) IN NACL 100-0.45 UNIT/ML-% IJ SOLN
1300.0000 [IU]/h | INTRAMUSCULAR | Status: DC
  Administered 2013-06-26: 1300 [IU]/h via INTRAVENOUS
  Filled 2013-06-26 (×2): qty 250

## 2013-06-26 MED ORDER — FENTANYL CITRATE 0.05 MG/ML IJ SOLN
INTRAMUSCULAR | Status: AC
  Filled 2013-06-26: qty 2

## 2013-06-26 MED ORDER — ASPIRIN 81 MG PO CHEW
324.0000 mg | CHEWABLE_TABLET | Freq: Once | ORAL | Status: AC
  Administered 2013-06-26: 324 mg via ORAL
  Filled 2013-06-26: qty 4

## 2013-06-26 MED ORDER — LIDOCAINE HCL (PF) 1 % IJ SOLN
INTRAMUSCULAR | Status: AC
  Filled 2013-06-26: qty 30

## 2013-06-26 MED ORDER — VERAPAMIL HCL 2.5 MG/ML IV SOLN
INTRAVENOUS | Status: AC
  Filled 2013-06-26: qty 2

## 2013-06-26 MED ORDER — TICAGRELOR 90 MG PO TABS
ORAL_TABLET | ORAL | Status: AC
  Filled 2013-06-26: qty 2

## 2013-06-26 MED ORDER — HEPARIN BOLUS VIA INFUSION
4000.0000 [IU] | Freq: Once | INTRAVENOUS | Status: AC
  Administered 2013-06-26: 4000 [IU] via INTRAVENOUS

## 2013-06-26 MED ORDER — HEPARIN (PORCINE) IN NACL 2-0.9 UNIT/ML-% IJ SOLN
INTRAMUSCULAR | Status: AC
  Filled 2013-06-26: qty 1000
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
## 2013-06-26 NOTE — ED Notes (Signed)
Pt states he is not having any pain. Pt states he feels numbness on the left side of body and mouth that is clearing up

## 2013-06-26 NOTE — CV Procedure (Signed)
Diagnostic Cardiac Catheterization with PCI Report  John Forbes  52 y.o.  male 09/04/1961  Procedure Date:06/26/2013  Referring Physician: Armanda Magic, M.D. Primary Cardiologist:: Armanda Magic, M.D.   PROCEDURE:  Left heart catheterization with selective coronary angiography, left ventriculogram.  INDICATIONS:  Unstable angina pectoris with pain occurring at rest responsive to sublingual nitroglycerin . History of PDA stent implantation 2013. Continued smoking history. Medication compliance is questionable.  The risks, benefits, and details of the procedure were explained to the patient.  The patient verbalized understanding and wanted to proceed.  Informed written consent was obtained.  PROCEDURE TECHNIQUE:  After Xylocaine anesthesia a 5 French Slender sheath was placed in the right radial artery with a single anterior needle wall stick.   Coronary angiography was done using a 5 [place] JR 4, JL 3.5, and 6 [place] JR 4 (guide) catheters.  Left ventriculography was done using JR 45 French diagnostic catheter by hand injection.   The diagnostic procedure was performed with weight-based heparin. We reviewed the images and identified what appeared to be significant restenosis in the proximal margin of the PDA stent. The mid to distal RCA contains an intermediate stenosis as it had previously. He did not appear to have changed.  Based upon the patient's presentation and the clinical appearance of the PDA region of restenosis, we proceeded to PCI. Because of the patient's known noncompliance with dual antiplatelet therapy. We decided to perform Angiosculpt scoring balloon angioplasty of the focal region of restenosis. Using the 6 [place] JR 4 guide catheter and additional weight-based heparin, we obtained an ACT greater than 300 seconds. We then were able to advance a BMW wire into the distal PDA. A 2.5 x 6 mm long Angiosculpt balloon was used for angioplasty. A single inflation up to 12  atmospheres (effective diameter 2.7 mm) for 2 minutes and 30 seconds. There was no associated chest discomfort. The post procedure angiographic results were significantly improved compared to the diagnostic images. No complications occurred.   CONTRAST:  Total of 190 cc.  COMPLICATIONS:  None.    HEMODYNAMICS:  Aortic pressure was 113/64 mmHg; LV pressure was 114/7 mmHg; LVEDP 15 mm mercury.  There was no gradient between the left ventricle and aorta.    ANGIOGRAPHIC DATA:   The left main coronary artery is very large and widely patent.  The left anterior descending artery is transapical and widely patent with proximal luminal irregularities. There is a proximal diagonal that is also widely patent.. No obstruction greater than 50% is noted.  The left circumflex artery is large and contains luminal irregularities. A branch of the first obtuse marginal contains eccentric 60-70% narrowing. The other branch of the first obtuse marginal contains 40% narrowing both occurring distal to the bifurcation/at the bifurcation..  The right coronary artery is dominant. There is segmental 50-70% mid vessel narrowing. This is unchanged from catheterization a year ago. Distal before the origin of the PDA there is concentric 40-50% narrowing. The previously placed stent in the largest inferior wall branch of the right coronary is widely patent except at the proximal margin where there is a hazy greater than 80% region of in-stent restenosis. This area is very focal..  LEFT VENTRICULOGRAM:  Left ventricular angiogram was done in the 30 RAO projection and revealed normal left ventricular wall motion and systolic function with an estimated ejection fraction of 50%.      PCI RESULTS: The proximal margin of the PDA stent contains 80-85% focal ISR. This  region of restenosis was reduced to less than 20% with Angiosculpt balloon angioplasty. TIMI grade 3 flow was noted post procedure. No complications occurred. The  improvement in lumen was felt to represent a significant clinical improvement. There was no chest discomfort with a prolonged balloon inflation of greater than 2 minutes 30 seconds.  IMPRESSIONS:  1. Unstable angina based upon clinical presentation  2. In-stent restenosis in the previously placed PDA drug-eluting stent in a patient who has been noncompliant with dual antiplatelet therapy. The greater than 80% stenosis was reduced to less than 20% with TIMI grade 3 flow after Angiosculpt PCI.  3. Moderate stenosis in the mid right coronary unchanged from previous assessment. Luminal irregularities in the LAD. 50-70% stenosis in a branch of the first obtuse marginal. Widely patent left main  4. Low normal LV systolic function  5. The patient did not develop chest discomfort with prolonged balloon inflation greater than 2 minutes 30 seconds.   RECOMMENDATION:  1. Dual antiplatelet therapy for one month and then discontinue  2. Risk factor modification  3. Further management per Dr. Mayford Knife.

## 2013-06-26 NOTE — H&P (Signed)
52 year old gentleman with prior history of coronary artery disease and PDA stenting. Presented on this occasion with prolonged chest pain relieved by sublingual nitroglycerin and a history of exertion related chest tightness relieved with rest. Cardiac markers and EKGs have been unremarkable. Dr. Mayford Knife advised the patient to proceed with coronary angiography and possible stenting. He has agreed to proceed and understands the procedure could be complicated by stroke, death, myocardial infarction, allergy, bleeding, limb ischemia, emergency surgery, kidney injury, among others. He has consented to proceed toCath Lab Visit (complete for each Cath Lab visit)  Clinical Evaluation Leading to the Procedure:   ACS: yes  Non-ACS:    Anginal Classification: CCS IV  Anti-ischemic medical therapy: No Therapy  Non-Invasive Test Results: No non-invasive testing performed  Prior CABG: No previous CABG

## 2013-06-26 NOTE — Care Management Note (Addendum)
    Page 1 of 1   06/27/2013     4:20:14 PM   CARE MANAGEMENT NOTE 06/27/2013  Patient:  John Forbes,John Forbes   Account Number:  [id number]  Date Initiated:  06/26/2013  Documentation initiated by:  Junius Creamer  Subjective/Objective Assessment:   adm w angina     Action/Plan:   lives w fam, lists [facility] he sees for [facility], no pcp   Anticipated DC Date:  06/27/2013   Anticipated DC Plan:  HOME/SELF CARE      DC Planning Services  CM consult  Medication Assistance  [facility]      Choice offered to / List presented to:             Status of service:   Medicare Important Message given?   (If response is "NO", the following Medicare IM given date fields will be blank) Date Medicare IM given:   Date Additional Medicare IM given:    Discharge Disposition:  HOME/SELF CARE  Per UR Regulation:  Reviewed for med. necessity/level of care/duration of stay  If discussed at Long Length of Stay Meetings, dates discussed:    Comments:  7/11 1618p  debbie Amariah Kierstead rn,bsn pt was disch but heard back from [place] [facility]. he has appt for july 21 at 12n. spoke w pt by phone to relay time of appt, address and phone #. he plans on going for appt.  7/10 0953 debbie Emmert Roethler rn,bsn spoke w pt. he has been on brilinta for about Forbes year. he gets med from drug company. he states about to run out. placed new pt assist form on chart for md to sign. will give pt new brilinta 30day free and copay assist card which he may not be able to use since already in program. went to [facility] until they closed so no pcp at present. agreeable to me sending inform to [facility] so they can set him up an appointment. will leave resource list w their phone # and address. will leave 2 prescription discount cards that may help w brand name meds.
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
## 2013-06-26 NOTE — Progress Notes (Signed)
ANTICOAGULATION CONSULT NOTE - Follow Up Consult  Pharmacy Consult for Heparin Indication: chest pain/ACS  Allergies  Allergen Reactions  . Other Anaphylaxis and Other (See Comments)    "Preservatives"-red in the face and anaphylaxis     Patient Measurements: Height: 5\' 9"  (175.3 cm) Weight: 211 lb 13.8 oz (96.1 kg) IBW/kg (Calculated) : 70.7 Heparin Dosing Weight: 90 kg   Vital Signs: Temp: 97.6 F (36.4 C) (07/10 1200) Temp src: Oral (07/10 1200) BP: 126/77 mmHg (07/10 1200) Pulse Rate: 92 (07/10 1200)  Labs:  Recent Labs  06/26/13 0050 06/26/13 0500 06/26/13 0800 06/26/13 1156 06/26/13 1210  HGB 14.8 14.3  --   --  14.7  HCT 42.4 41.3  --   --  43.5  PLT 133* 121*  --   --  125*  LABPROT  --  12.1  --   --  12.9  INR  --  0.91  --   --  0.99  HEPARINUNFRC  --   --   --   --  0.41  CREATININE 1.10  --  1.09  --  0.99  TROPONINI  --  <0.30  --  <0.30  --     Estimated Creatinine Clearance: 99.9 ml/min (by C-G formula based on Cr of 0.99).   Medications:  Prescriptions prior to admission  Medication Sig Dispense Refill  . Ticagrelor (BRILINTA) 90 MG TABS tablet Take 1 tablet (90 mg total) by mouth 2 (two) times daily.  60 tablet  11  . nitroGLYCERIN (NITROSTAT) 0.4 MG SL tablet Place 1 tablet (0.4 mg total) under the tongue every 5 (five) minutes as needed for chest pain.  25 tablet  5   Scheduled:  . [START ON 06/27/2013] aspirin EC  81 mg Oral Daily  . atorvastatin  40 mg Oral q1800  . sodium chloride  3 mL Intravenous Q12H  . Ticagrelor  90 mg Oral BID    Assessment: 52 yo M with h/o PCI and residual CAD c/o CP associated w/ SOB, diaphoresis and nausea, mostly relieved w/ NTG x 3. Does not always take his Brilinta. Pharmacy consulted to dose heparin. Started at 1300 units/hr. HL today 0.41, plts stable. Scheduled for cath so will f/u.  Goal of Therapy:  Heparin level 0.3-0.7 units/ml Monitor platelets by anticoagulation protocol: Yes   Plan:  - No  heparin changes needed - F/u post cath

## 2013-06-26 NOTE — Progress Notes (Signed)
Nutrition Brief Note  Malnutrition Screening Tool result is inaccurate.  Please consult if nutrition needs are identified.  Anecia Nusbaum, MS RD LDN Clinical Inpatient Dietitian Pager: [phone] Weekend/After hours pager: [phone]   
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
## 2013-06-26 NOTE — Progress Notes (Signed)
TR BAND REMOVAL  LOCATION:    right radial  DEFLATED PER PROTOCOL:    yes  TIME BAND OFF / DRESSING APPLIED:    1845   SITE UPON ARRIVAL:    Level 0  SITE AFTER BAND REMOVAL:    Level 0  REVERSE ALLEN'S TEST:     positive  CIRCULATION SENSATION AND MOVEMENT:    Within Normal Limits   yes  COMMENTS:   Gauze dressing dry and intact when rechecked at 1915, no change in assessment
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
## 2013-06-26 NOTE — ED Notes (Signed)
The pt has had lt upper chest pain for one hour .  He has taken 3 sl nitro and still has a little pain.

## 2013-06-26 NOTE — Progress Notes (Signed)
SUBJECTIVE:  No further CP overnight OBJECTIVE:   Vitals:   Filed Vitals:   06/26/13 0430 06/26/13 0445 06/26/13 0500 06/26/13 0545  BP: 115/71 116/72 113/71 135/76  Pulse: 61 55 58 47  Temp:    97.8 F (36.6 C)  TempSrc:    Oral  Resp:   18 21  Height:    5\' 9"  (1.753 m)  Weight:    96.1 kg (211 lb 13.8 oz)  SpO2: 91% 90% 94% 92%   I&O's:   Intake/Output Summary (Last 24 hours) at 06/26/13 0859 Last data filed at 06/26/13 0700  Gross per 24 hour  Intake     26 ml  Output      0 ml  Net     26 ml   TELEMETRY: Reviewed telemetry pt in NSR:     PHYSICAL EXAM General: Well developed, well nourished, in no acute distress Head: Eyes PERRLA, No xanthomas.   Normal cephalic and atramatic  Lungs:   Clear bilaterally to auscultation and percussion. Heart:   HRRR S1 S2 Pulses are 2+ & equal.            No carotid bruit. No JVD.  No abdominal bruits. No femoral bruits. Abdomen: Bowel sounds are positive, abdomen soft and non-tender without masses or                  Hernia's noted. Msk:  Back normal, normal gait. Normal strength and tone for age. Extremities:   No clubbing, cyanosis or edema.  DP +1 Neuro: Alert and oriented X 3. Psych:  Good affect, responds appropriately   LABS: Basic Metabolic Panel:  Recent Labs  16/10/96 0050  NA 138  K 3.8  CL 105  CO2 25  GLUCOSE 107*  BUN 18  CREATININE 1.10  CALCIUM 8.8   Liver Function Tests: No results found for this basename: AST, ALT, ALKPHOS, BILITOT, PROT, ALBUMIN,  in the last 72 hours No results found for this basename: LIPASE, AMYLASE,  in the last 72 hours CBC:  Recent Labs  06/26/13 0050 06/26/13 0500  WBC 7.9 7.8  NEUTROABS  --  4.1  HGB 14.8 14.3  HCT 42.4 41.3  MCV 88.3 88.8  PLT 133* 121*   Cardiac Enzymes:  Recent Labs  06/26/13 0500  TROPONINI <0.30   Coag Panel:   Lab Results  Component Value Date   INR 0.91 06/26/2013    RADIOLOGY: Dg Chest 2 View  06/26/2013   *RADIOLOGY REPORT*   Clinical Data: Chest pain.  CHEST - 2 VIEW  Comparison: None.  Findings: There is a scarring in the lower lungs bilaterally. Heart is normal size.  No effusions.  Biapical scarring.  No acute bony abnormality.  Mediastinal contours within normal limits.  IMPRESSION: Biapical and bibasilar scarring.  No acute findings.   Original Report Authenticated By: Charlett Nose, M.D.    ASSESSMENT: 52 yo male with h/o known CAD, s/p PDA DES and has residual CAD including a 70% mid RCA disease not hemodynamically significant by FFR last year, who p/w angina and bradycardia, with no significant ECG changes or troponin elevation.   Unstable angina: esp concerning for the RCA disease and PDA stent since patient is not fully taking DAPT   PLAN:  1. Cycle Troponin Q6  2. ASA and Brinlinta  3. Continue Heparin drip  4. Nitro drip if CP recurs  3. Plan cath today with possible PCI to RCA/PDA stent by Dr. Katrinka Blazing  Quintella Reichert, MD  06/26/2013  8:59 AM

## 2013-06-27 LAB — CBC
HCT: 41.3 % (ref 39.0–52.0)
Hemoglobin: 14.3 g/dL (ref 13.0–17.0)
MCH: 30.6 pg (ref 26.0–34.0)
MCV: 88.2 fL (ref 78.0–100.0)
RBC: 4.68 MIL/uL (ref 4.22–5.81)

## 2013-06-27 MED ORDER — NITROGLYCERIN 0.4 MG SL SUBL: 0.4000 mg | SUBLINGUAL_TABLET | SUBLINGUAL | Status: AC | PRN

## 2013-06-27 MED ORDER — ASPIRIN 81 MG PO TBEC: 81.0000 mg | DELAYED_RELEASE_TABLET | Freq: Every day | ORAL | Status: DC

## 2013-06-27 MED ORDER — ATORVASTATIN CALCIUM 40 MG PO TABS: 20.0000 mg | ORAL_TABLET | Freq: Every day | ORAL | Status: DC

## 2013-06-27 MED ORDER — TICAGRELOR 90 MG PO TABS: 90.0000 mg | ORAL_TABLET | Freq: Two times a day (BID) | ORAL | Status: DC

## 2013-06-27 MED ORDER — ATORVASTATIN CALCIUM 40 MG PO TABS: 40.0000 mg | ORAL_TABLET | Freq: Every day | ORAL | Status: DC

## 2013-06-27 NOTE — Plan of Care (Signed)
Problem: Consults Goal: Tobacco Cessation referral if indicated Outcome: Completed/Met Date Met:  06/27/13 Smoking cessation discussed, pt states he quit several times but voices no desire to quit smoking at this time.  Tips for success and support hotline number given.

## 2013-06-27 NOTE — Progress Notes (Signed)
CARDIAC REHAB PHASE I   PRE:  Rate/Rhythm: 52 SB while sleeping    BP: sitting 127/88    SaO2:   MODE:  Ambulation: 450 ft   POST:  Rate/Rhythm: 72 SR    BP: sitting 131/75     SaO2:   Pt ambulated without any complication. Sts he feels better. Attempted education however pt was not very receptive. Sts he does not feel that smoking causing the injury that research says it does. Likes to follow his own diet. 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 Materials given anyway. I had difficulty explaining NTG to him. When explaining how to take NTG, pt sts "I don't have a way to get around so what am I supposed to do." Pt would benefit from reinforcement. Not interested in CRPII.  [phone]  Elissa Lovett Lawton CES, ACSM 06/27/2013 8:28 AM
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
## 2013-07-07 ENCOUNTER — Ambulatory Visit: Payer: Self-pay | Admitting: Internal Medicine

## 2013-07-07 ENCOUNTER — Encounter: Payer: Self-pay | Admitting: Internal Medicine

## 2013-07-07 NOTE — Progress Notes (Unsigned)
Patient presents for hospital follow up from the 10th; patient states he went into [facility] for chest pain on July 9th and had a stent place on the 10th, was released on the 11th. Denies chest pain.
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
## 2013-07-15 ENCOUNTER — Ambulatory Visit: Payer: Self-pay | Attending: Family Medicine

## 2013-09-08 ENCOUNTER — Encounter: Payer: Self-pay | Admitting: Internal Medicine

## 2013-09-08 ENCOUNTER — Ambulatory Visit: Payer: No Typology Code available for payment source | Attending: Internal Medicine | Admitting: Internal Medicine

## 2013-09-08 VITALS — BP 125/87 | HR 61 | Temp 97.8°F | Resp 14 | Ht 72.0 in | Wt 217.2 lb

## 2013-09-08 DIAGNOSIS — F17200 Nicotine dependence, unspecified, uncomplicated: Secondary | ICD-10-CM | POA: Insufficient documentation

## 2013-09-08 DIAGNOSIS — J4489 Other specified chronic obstructive pulmonary disease: Secondary | ICD-10-CM | POA: Insufficient documentation

## 2013-09-08 DIAGNOSIS — J449 Chronic obstructive pulmonary disease, unspecified: Secondary | ICD-10-CM | POA: Insufficient documentation

## 2013-09-08 DIAGNOSIS — K219 Gastro-esophageal reflux disease without esophagitis: Secondary | ICD-10-CM | POA: Insufficient documentation

## 2013-09-08 DIAGNOSIS — Z09 Encounter for follow-up examination after completed treatment for conditions other than malignant neoplasm: Secondary | ICD-10-CM | POA: Insufficient documentation

## 2013-09-08 DIAGNOSIS — K5900 Constipation, unspecified: Secondary | ICD-10-CM | POA: Insufficient documentation

## 2013-09-08 DIAGNOSIS — I10 Essential (primary) hypertension: Secondary | ICD-10-CM

## 2013-09-08 DIAGNOSIS — I2510 Atherosclerotic heart disease of native coronary artery without angina pectoris: Secondary | ICD-10-CM | POA: Insufficient documentation

## 2013-09-08 MED ORDER — TRAMADOL HCL 50 MG PO TABS: 50.0000 mg | ORAL_TABLET | Freq: Three times a day (TID) | ORAL | Status: DC | PRN

## 2013-09-08 MED ORDER — CYCLOBENZAPRINE HCL 10 MG PO TABS: 10.0000 mg | ORAL_TABLET | Freq: Three times a day (TID) | ORAL | Status: DC | PRN

## 2013-09-08 MED ORDER — PANTOPRAZOLE SODIUM 40 MG PO TBEC: 40.0000 mg | DELAYED_RELEASE_TABLET | Freq: Every day | ORAL | Status: DC

## 2013-09-08 MED ORDER — ALBUTEROL SULFATE HFA 108 (90 BASE) MCG/ACT IN AERS: 2.0000 | INHALATION_SPRAY | Freq: Four times a day (QID) | RESPIRATORY_TRACT | Status: DC | PRN

## 2013-09-08 MED ORDER — ALBUTEROL SULFATE (2.5 MG/3ML) 0.083% IN NEBU: 2.5000 mg | INHALATION_SOLUTION | Freq: Four times a day (QID) | RESPIRATORY_TRACT | Status: AC | PRN

## 2013-09-08 MED ORDER — RANITIDINE HCL 150 MG PO TABS: 150.0000 mg | ORAL_TABLET | Freq: Two times a day (BID) | ORAL | Status: DC

## 2013-09-08 NOTE — Patient Instructions (Signed)
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
## 2013-09-08 NOTE — Progress Notes (Signed)
Pt is here as a f/u on medication from last visit 06/26/2013. Pt had heart surgery 06/26/13.

## 2013-09-08 NOTE — Addendum Note (Signed)
Addended by: Dorothea Ogle on: 09/08/2013 12:22 PM   Modules accepted: Orders

## 2013-09-08 NOTE — Addendum Note (Signed)
Addended by: Dorothea Ogle on: 09/08/2013 12:19 PM   Modules accepted: Orders

## 2013-09-16 ENCOUNTER — Other Ambulatory Visit: Payer: Self-pay | Admitting: Cardiology

## 2013-09-16 DIAGNOSIS — Z79899 Other long term (current) drug therapy: Secondary | ICD-10-CM

## 2013-09-16 DIAGNOSIS — E785 Hyperlipidemia, unspecified: Secondary | ICD-10-CM

## 2013-10-04 ENCOUNTER — Emergency Department (HOSPITAL_COMMUNITY): Payer: No Typology Code available for payment source

## 2013-10-04 ENCOUNTER — Encounter (HOSPITAL_COMMUNITY): Payer: Self-pay | Admitting: Emergency Medicine

## 2013-10-04 ENCOUNTER — Emergency Department (HOSPITAL_COMMUNITY)
Admission: EM | Admit: 2013-10-04 | Discharge: 2013-10-04 | Disposition: A | Discharge status: Home / Self Care | Payer: No Typology Code available for payment source | Attending: Emergency Medicine | Admitting: Emergency Medicine

## 2013-10-04 DIAGNOSIS — Z7982 Long term (current) use of aspirin: Secondary | ICD-10-CM | POA: Insufficient documentation

## 2013-10-04 DIAGNOSIS — J449 Chronic obstructive pulmonary disease, unspecified: Secondary | ICD-10-CM | POA: Insufficient documentation

## 2013-10-04 DIAGNOSIS — Z8719 Personal history of other diseases of the digestive system: Secondary | ICD-10-CM | POA: Insufficient documentation

## 2013-10-04 DIAGNOSIS — K219 Gastro-esophageal reflux disease without esophagitis: Secondary | ICD-10-CM | POA: Insufficient documentation

## 2013-10-04 DIAGNOSIS — M542 Cervicalgia: Secondary | ICD-10-CM | POA: Insufficient documentation

## 2013-10-04 DIAGNOSIS — G8929 Other chronic pain: Secondary | ICD-10-CM | POA: Insufficient documentation

## 2013-10-04 DIAGNOSIS — I2510 Atherosclerotic heart disease of native coronary artery without angina pectoris: Secondary | ICD-10-CM | POA: Insufficient documentation

## 2013-10-04 DIAGNOSIS — Y929 Unspecified place or not applicable: Secondary | ICD-10-CM | POA: Insufficient documentation

## 2013-10-04 DIAGNOSIS — R0781 Pleurodynia: Secondary | ICD-10-CM

## 2013-10-04 DIAGNOSIS — M545 Low back pain, unspecified: Secondary | ICD-10-CM | POA: Insufficient documentation

## 2013-10-04 DIAGNOSIS — Y9389 Activity, other specified: Secondary | ICD-10-CM | POA: Insufficient documentation

## 2013-10-04 DIAGNOSIS — IMO0002 Reserved for concepts with insufficient information to code with codable children: Secondary | ICD-10-CM | POA: Insufficient documentation

## 2013-10-04 DIAGNOSIS — Z79899 Other long term (current) drug therapy: Secondary | ICD-10-CM | POA: Insufficient documentation

## 2013-10-04 DIAGNOSIS — Z860100 Personal history of colon polyps, unspecified: Secondary | ICD-10-CM | POA: Insufficient documentation

## 2013-10-04 DIAGNOSIS — Z8619 Personal history of other infectious and parasitic diseases: Secondary | ICD-10-CM | POA: Insufficient documentation

## 2013-10-04 DIAGNOSIS — F17200 Nicotine dependence, unspecified, uncomplicated: Secondary | ICD-10-CM | POA: Insufficient documentation

## 2013-10-04 DIAGNOSIS — J4489 Other specified chronic obstructive pulmonary disease: Secondary | ICD-10-CM | POA: Insufficient documentation

## 2013-10-04 DIAGNOSIS — R296 Repeated falls: Secondary | ICD-10-CM | POA: Insufficient documentation

## 2013-10-04 MED ORDER — HYDROCODONE-ACETAMINOPHEN 5-325 MG PO TABS
2.0000 | ORAL_TABLET | Freq: Once | ORAL | Status: AC
  Administered 2013-10-04: 2 via ORAL
  Filled 2013-10-04: qty 2

## 2013-10-04 MED ORDER — HYDROCODONE-ACETAMINOPHEN 5-325 MG PO TABS: 1.0000 | ORAL_TABLET | ORAL | Status: DC | PRN

## 2013-10-04 NOTE — ED Notes (Signed)
Pt dc to home. Pt sts understanding to dc instructions. Pt ambulatory to exit without difficulty.  Pt denies need for w/c.  

## 2013-10-04 NOTE — ED Provider Notes (Signed)
  Medical screening examination/treatment/procedure(s) were performed by non-physician practitioner and as supervising physician I was immediately available for consultation/collaboration.    Gerhard Munch, MD 10/04/13 [phone]

## 2013-10-04 NOTE — ED Notes (Signed)
Pt reports falling 3 days ago and hit left side on object, felt something pop in rib area and now having pain, increases with breathing and movement.
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
## 2013-10-04 NOTE — ED Notes (Signed)
Pt refused the incentive spirometeor due to having one at home for use.

## 2013-11-04 ENCOUNTER — Telehealth: Payer: Self-pay

## 2013-11-04 NOTE — Telephone Encounter (Signed)
Spoke with crystal [facility]. Patient is prescribed albuteral states makes him feel funny i did not see anywhere in notes that this has happened to him He has been using the in haler since last year

## 2013-12-22 ENCOUNTER — Encounter: Payer: Self-pay | Admitting: General Surgery

## 2013-12-22 DIAGNOSIS — E785 Hyperlipidemia, unspecified: Secondary | ICD-10-CM

## 2013-12-24 ENCOUNTER — Encounter: Payer: Self-pay | Admitting: Internal Medicine

## 2013-12-24 ENCOUNTER — Ambulatory Visit: Payer: No Typology Code available for payment source | Attending: Internal Medicine | Admitting: Internal Medicine

## 2013-12-24 VITALS — BP 117/85 | HR 76 | Temp 98.0°F | Resp 16 | Wt 212.2 lb

## 2013-12-24 DIAGNOSIS — I2510 Atherosclerotic heart disease of native coronary artery without angina pectoris: Secondary | ICD-10-CM

## 2013-12-24 DIAGNOSIS — K219 Gastro-esophageal reflux disease without esophagitis: Secondary | ICD-10-CM

## 2013-12-24 DIAGNOSIS — G8929 Other chronic pain: Secondary | ICD-10-CM

## 2013-12-24 DIAGNOSIS — F17200 Nicotine dependence, unspecified, uncomplicated: Secondary | ICD-10-CM

## 2013-12-24 DIAGNOSIS — M545 Low back pain, unspecified: Secondary | ICD-10-CM

## 2013-12-24 DIAGNOSIS — E785 Hyperlipidemia, unspecified: Secondary | ICD-10-CM

## 2013-12-24 MED ORDER — CYCLOBENZAPRINE HCL 10 MG PO TABS: 10.0000 mg | ORAL_TABLET | Freq: Three times a day (TID) | ORAL | Status: DC | PRN

## 2013-12-24 MED ORDER — TRAMADOL HCL 50 MG PO TABS: 50.0000 mg | ORAL_TABLET | Freq: Three times a day (TID) | ORAL | Status: DC | PRN

## 2013-12-24 MED ORDER — RANITIDINE HCL 150 MG PO TABS: 150.0000 mg | ORAL_TABLET | Freq: Two times a day (BID) | ORAL | Status: AC

## 2013-12-24 MED ORDER — ROSUVASTATIN CALCIUM 20 MG PO TABS: 20.0000 mg | ORAL_TABLET | Freq: Two times a day (BID) | ORAL | Status: DC
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
## 2013-12-24 NOTE — Progress Notes (Signed)
Patient here for medication refills Would like a script for zantac- [facility] Instead of refill on protonix

## 2014-01-12 ENCOUNTER — Ambulatory Visit: Payer: Self-pay | Admitting: Cardiology

## 2014-01-19 ENCOUNTER — Ambulatory Visit: Payer: No Typology Code available for payment source | Attending: Internal Medicine

## 2014-01-19 ENCOUNTER — Encounter: Payer: Self-pay | Admitting: Cardiology

## 2014-01-19 ENCOUNTER — Ambulatory Visit (INDEPENDENT_AMBULATORY_CARE_PROVIDER_SITE_OTHER): Payer: No Typology Code available for payment source | Admitting: Cardiology

## 2014-01-19 VITALS — BP 128/80 | HR 67 | Ht 71.0 in | Wt 210.0 lb

## 2014-01-19 DIAGNOSIS — I472 Ventricular tachycardia: Secondary | ICD-10-CM | POA: Insufficient documentation

## 2014-01-19 DIAGNOSIS — I4729 Other ventricular tachycardia: Secondary | ICD-10-CM | POA: Insufficient documentation

## 2014-01-19 DIAGNOSIS — E785 Hyperlipidemia, unspecified: Secondary | ICD-10-CM

## 2014-01-19 DIAGNOSIS — F17200 Nicotine dependence, unspecified, uncomplicated: Secondary | ICD-10-CM

## 2014-01-19 DIAGNOSIS — I2510 Atherosclerotic heart disease of native coronary artery without angina pectoris: Secondary | ICD-10-CM

## 2014-01-19 NOTE — Patient Instructions (Signed)
Your physician recommends that you continue on your current medications as directed. Please refer to the Current Medication list given to you today.  Your physician wants you to follow-up in: 6 Months with Dr Turner You will receive a reminder letter in the mail two months in advance. If you don't receive a letter, please call our office to schedule the follow-up appointment.  
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
## 2014-02-09 ENCOUNTER — Telehealth: Payer: Self-pay | Admitting: Emergency Medicine

## 2014-02-09 NOTE — Telephone Encounter (Signed)
Pt comes in requesting pain medication refill Tramadol for pain Pt prescribed #90, no refills 12/24/13 Dr. Annitta Needs refused to refill

## 2014-03-09 ENCOUNTER — Other Ambulatory Visit: Payer: Self-pay

## 2014-03-19 ENCOUNTER — Ambulatory Visit: Payer: No Typology Code available for payment source | Admitting: Internal Medicine

## 2014-11-26 ENCOUNTER — Encounter (HOSPITAL_COMMUNITY): Payer: Self-pay | Admitting: Cardiology

## 2017-01-22 ENCOUNTER — Encounter (HOSPITAL_COMMUNITY): Payer: Self-pay | Admitting: Emergency Medicine

## 2017-01-22 ENCOUNTER — Emergency Department (HOSPITAL_COMMUNITY)
Admission: EM | Admit: 2017-01-22 | Discharge: 2017-01-22 | Disposition: A | Discharge status: Home / Self Care | Payer: Medicare Other | Attending: Emergency Medicine | Admitting: Emergency Medicine

## 2017-01-22 DIAGNOSIS — R109 Unspecified abdominal pain: Secondary | ICD-10-CM | POA: Insufficient documentation

## 2017-01-22 DIAGNOSIS — Z5321 Procedure and treatment not carried out due to patient leaving prior to being seen by health care provider: Secondary | ICD-10-CM | POA: Insufficient documentation

## 2017-01-22 LAB — COMPREHENSIVE METABOLIC PANEL
ALK PHOS: 313 U/L — AB (ref 38–126)
ALT: 289 U/L — ABNORMAL HIGH (ref 17–63)
AST: 86 U/L — AB (ref 15–41)
Albumin: 3.6 g/dL (ref 3.5–5.0)
Anion gap: 10 (ref 5–15)
BILIRUBIN TOTAL: 1.7 mg/dL — AB (ref 0.3–1.2)
BUN: 13 mg/dL (ref 6–20)
CO2: 25 mmol/L (ref 22–32)
Calcium: 9.1 mg/dL (ref 8.9–10.3)
Chloride: 103 mmol/L (ref 101–111)
Creatinine, Ser: 0.83 mg/dL (ref 0.61–1.24)
GFR calc Af Amer: >60 mL/min (ref >60)
GLUCOSE: 130 mg/dL — AB (ref 65–99)
POTASSIUM: 4 mmol/L (ref 3.5–5.1)
Sodium: 138 mmol/L (ref 135–145)
TOTAL PROTEIN: 6.7 g/dL (ref 6.5–8.1)

## 2017-01-22 LAB — CBC
HCT: 41.3 % (ref 39.0–52.0)
Hemoglobin: 13.9 g/dL (ref 13.0–17.0)
MCH: 30.8 pg (ref 26.0–34.0)
MCHC: 33.7 g/dL (ref 30.0–36.0)
MCV: 91.4 fL (ref 78.0–100.0)
Platelets: 160 10*3/uL (ref 150–400)
RBC: 4.52 MIL/uL (ref 4.22–5.81)
RDW: 15 % (ref 11.5–15.5)
WBC: 10.7 10*3/uL — AB (ref 4.0–10.5)

## 2017-01-22 LAB — I-STAT TROPONIN, ED: TROPONIN I, POC: 0 ng/mL (ref 0.00–0.08)

## 2017-01-22 LAB — LIPASE, BLOOD: Lipase: 37 U/L (ref 11–51)

## 2017-01-22 NOTE — ED Triage Notes (Signed)
Pt from home with epigastric pain s/p eating bbq.  Pt endorses abdominal bloating, gas, and severe pain with hx of the same.  NAD, A&O.

## 2017-06-17 DIAGNOSIS — K819 Cholecystitis, unspecified: Secondary | ICD-10-CM

## 2017-06-17 HISTORY — DX: Cholecystitis, unspecified: K81.9

## 2017-07-07 ENCOUNTER — Emergency Department (HOSPITAL_COMMUNITY): Payer: Medicare Other

## 2017-07-07 ENCOUNTER — Other Ambulatory Visit: Payer: Self-pay

## 2017-07-07 ENCOUNTER — Encounter (HOSPITAL_COMMUNITY): Payer: Self-pay

## 2017-07-07 ENCOUNTER — Encounter (HOSPITAL_COMMUNITY): Payer: Self-pay | Admitting: *Deleted

## 2017-07-07 ENCOUNTER — Emergency Department (HOSPITAL_COMMUNITY)
Admission: EM | Admit: 2017-07-07 | Discharge: 2017-07-07 | Discharge status: Against Medical Advice | Payer: Medicare Other | Source: Home / Self Care | Attending: Emergency Medicine | Admitting: Emergency Medicine

## 2017-07-07 ENCOUNTER — Inpatient Hospital Stay (HOSPITAL_COMMUNITY)
Admission: EM | Admit: 2017-07-07 | Discharge: 2017-07-13 | DRG: 417 | Disposition: A | Discharge status: Home / Self Care | Payer: Medicare Other | Attending: Internal Medicine | Admitting: Internal Medicine

## 2017-07-07 DIAGNOSIS — R05 Cough: Secondary | ICD-10-CM | POA: Diagnosis not present

## 2017-07-07 DIAGNOSIS — I25110 Atherosclerotic heart disease of native coronary artery with unstable angina pectoris: Secondary | ICD-10-CM | POA: Diagnosis not present

## 2017-07-07 DIAGNOSIS — B9620 Unspecified Escherichia coli [E. coli] as the cause of diseases classified elsewhere: Secondary | ICD-10-CM | POA: Diagnosis present

## 2017-07-07 DIAGNOSIS — K8066 Calculus of gallbladder and bile duct with acute and chronic cholecystitis without obstruction: Secondary | ICD-10-CM | POA: Diagnosis present

## 2017-07-07 DIAGNOSIS — K819 Cholecystitis, unspecified: Secondary | ICD-10-CM

## 2017-07-07 DIAGNOSIS — G8929 Other chronic pain: Secondary | ICD-10-CM | POA: Diagnosis present

## 2017-07-07 DIAGNOSIS — Z888 Allergy status to other drugs, medicaments and biological substances status: Secondary | ICD-10-CM

## 2017-07-07 DIAGNOSIS — K7200 Acute and subacute hepatic failure without coma: Secondary | ICD-10-CM | POA: Insufficient documentation

## 2017-07-07 DIAGNOSIS — I2510 Atherosclerotic heart disease of native coronary artery without angina pectoris: Secondary | ICD-10-CM | POA: Diagnosis present

## 2017-07-07 DIAGNOSIS — R109 Unspecified abdominal pain: Secondary | ICD-10-CM

## 2017-07-07 DIAGNOSIS — Z01810 Encounter for preprocedural cardiovascular examination: Secondary | ICD-10-CM

## 2017-07-07 DIAGNOSIS — Z8679 Personal history of other diseases of the circulatory system: Secondary | ICD-10-CM

## 2017-07-07 DIAGNOSIS — R079 Chest pain, unspecified: Secondary | ICD-10-CM | POA: Diagnosis present

## 2017-07-07 DIAGNOSIS — F40240 Claustrophobia: Secondary | ICD-10-CM | POA: Diagnosis present

## 2017-07-07 DIAGNOSIS — Z7982 Long term (current) use of aspirin: Secondary | ICD-10-CM

## 2017-07-07 DIAGNOSIS — K851 Biliary acute pancreatitis without necrosis or infection: Secondary | ICD-10-CM | POA: Diagnosis not present

## 2017-07-07 DIAGNOSIS — Z91018 Allergy to other foods: Secondary | ICD-10-CM

## 2017-07-07 DIAGNOSIS — R001 Bradycardia, unspecified: Secondary | ICD-10-CM | POA: Diagnosis not present

## 2017-07-07 DIAGNOSIS — R945 Abnormal results of liver function studies: Secondary | ICD-10-CM

## 2017-07-07 DIAGNOSIS — Z79899 Other long term (current) drug therapy: Secondary | ICD-10-CM

## 2017-07-07 DIAGNOSIS — K8020 Calculus of gallbladder without cholecystitis without obstruction: Secondary | ICD-10-CM

## 2017-07-07 DIAGNOSIS — I5033 Acute on chronic diastolic (congestive) heart failure: Secondary | ICD-10-CM | POA: Diagnosis not present

## 2017-07-07 DIAGNOSIS — K219 Gastro-esophageal reflux disease without esophagitis: Secondary | ICD-10-CM | POA: Diagnosis present

## 2017-07-07 DIAGNOSIS — I099 Rheumatic heart disease, unspecified: Secondary | ICD-10-CM | POA: Diagnosis present

## 2017-07-07 DIAGNOSIS — R7989 Other specified abnormal findings of blood chemistry: Secondary | ICD-10-CM

## 2017-07-07 DIAGNOSIS — N390 Urinary tract infection, site not specified: Secondary | ICD-10-CM

## 2017-07-07 DIAGNOSIS — R1013 Epigastric pain: Secondary | ICD-10-CM | POA: Diagnosis not present

## 2017-07-07 DIAGNOSIS — I110 Hypertensive heart disease with heart failure: Secondary | ICD-10-CM | POA: Diagnosis present

## 2017-07-07 DIAGNOSIS — K8010 Calculus of gallbladder with chronic cholecystitis without obstruction: Secondary | ICD-10-CM | POA: Diagnosis not present

## 2017-07-07 DIAGNOSIS — R9439 Abnormal result of other cardiovascular function study: Secondary | ICD-10-CM | POA: Diagnosis present

## 2017-07-07 DIAGNOSIS — Z720 Tobacco use: Secondary | ICD-10-CM

## 2017-07-07 DIAGNOSIS — K859 Acute pancreatitis without necrosis or infection, unspecified: Secondary | ICD-10-CM

## 2017-07-07 DIAGNOSIS — Z955 Presence of coronary angioplasty implant and graft: Secondary | ICD-10-CM

## 2017-07-07 DIAGNOSIS — J449 Chronic obstructive pulmonary disease, unspecified: Secondary | ICD-10-CM

## 2017-07-07 DIAGNOSIS — J64 Unspecified pneumoconiosis: Secondary | ICD-10-CM | POA: Diagnosis present

## 2017-07-07 DIAGNOSIS — K810 Acute cholecystitis: Secondary | ICD-10-CM | POA: Diagnosis present

## 2017-07-07 DIAGNOSIS — F17210 Nicotine dependence, cigarettes, uncomplicated: Secondary | ICD-10-CM

## 2017-07-07 DIAGNOSIS — F17200 Nicotine dependence, unspecified, uncomplicated: Secondary | ICD-10-CM | POA: Diagnosis present

## 2017-07-07 HISTORY — DX: Cholecystitis, unspecified: K81.9

## 2017-07-07 LAB — CBC
HCT: 46.3 % (ref 39.0–52.0)
HEMATOCRIT: 44.4 % (ref 39.0–52.0)
HEMOGLOBIN: 15.5 g/dL (ref 13.0–17.0)
Hemoglobin: 14.7 g/dL (ref 13.0–17.0)
MCH: 30.4 pg (ref 26.0–34.0)
MCH: 30.5 pg (ref 26.0–34.0)
MCHC: 33.1 g/dL (ref 30.0–36.0)
MCHC: 33.5 g/dL (ref 30.0–36.0)
MCV: 91.7 fL (ref 78.0–100.0)
MCV: 91.1 fL (ref 78.0–100.0)
PLATELETS: 178 10*3/uL (ref 150–400)
Platelets: 178 10*3/uL (ref 150–400)
RBC: 4.84 MIL/uL (ref 4.22–5.81)
RBC: 5.08 MIL/uL (ref 4.22–5.81)
RDW: 15.2 % (ref 11.5–15.5)
RDW: 15.4 % (ref 11.5–15.5)
WBC: 6.4 10*3/uL (ref 4.0–10.5)
WBC: 12.8 10*3/uL — AB (ref 4.0–10.5)

## 2017-07-07 LAB — URINALYSIS, ROUTINE W REFLEX MICROSCOPIC
BILIRUBIN URINE: NEGATIVE
GLUCOSE, UA: NEGATIVE mg/dL
Hgb urine dipstick: NEGATIVE
Ketones, ur: NEGATIVE mg/dL
NITRITE: POSITIVE — AB
Protein, ur: NEGATIVE mg/dL
Specific Gravity, Urine: 1.005 (ref 1.005–1.030)
Squamous Epithelial / LPF: NONE SEEN
pH: 7 (ref 5.0–8.0)

## 2017-07-07 LAB — COMPREHENSIVE METABOLIC PANEL
ALT: 738 U/L — ABNORMAL HIGH (ref 17–63)
ALT: 685 U/L — AB (ref 17–63)
AST: 341 U/L — ABNORMAL HIGH (ref 15–41)
AST: 292 U/L — ABNORMAL HIGH (ref 15–41)
Albumin: 3.8 g/dL (ref 3.5–5.0)
Albumin: 4 g/dL (ref 3.5–5.0)
Alkaline Phosphatase: 170 U/L — ABNORMAL HIGH (ref 38–126)
Alkaline Phosphatase: 180 U/L — ABNORMAL HIGH (ref 38–126)
Anion gap: 8 (ref 5–15)
Anion gap: 11 (ref 5–15)
BILIRUBIN TOTAL: 4.1 mg/dL — AB (ref 0.3–1.2)
BUN: 11 mg/dL (ref 6–20)
BUN: 10 mg/dL (ref 6–20)
CHLORIDE: 105 mmol/L (ref 101–111)
CO2: 22 mmol/L (ref 22–32)
CO2: 21 mmol/L — ABNORMAL LOW (ref 22–32)
CREATININE: 1.02 mg/dL (ref 0.61–1.24)
Calcium: 8.9 mg/dL (ref 8.9–10.3)
Calcium: 9 mg/dL (ref 8.9–10.3)
Chloride: 105 mmol/L (ref 101–111)
Creatinine, Ser: 0.9 mg/dL (ref 0.61–1.24)
GFR calc Af Amer: >60 mL/min (ref >60)
GFR calc non Af Amer: >60 mL/min (ref >60)
Glucose, Bld: 95 mg/dL (ref 65–99)
Glucose, Bld: 121 mg/dL — ABNORMAL HIGH (ref 65–99)
POTASSIUM: 4.3 mmol/L (ref 3.5–5.1)
POTASSIUM: 3.9 mmol/L (ref 3.5–5.1)
Sodium: 135 mmol/L (ref 135–145)
Sodium: 137 mmol/L (ref 135–145)
Total Bilirubin: 4.4 mg/dL — ABNORMAL HIGH (ref 0.3–1.2)
Total Protein: 6.9 g/dL (ref 6.5–8.1)
Total Protein: 7.4 g/dL (ref 6.5–8.1)

## 2017-07-07 LAB — LIPASE, BLOOD: Lipase: 27 U/L (ref 11–51)

## 2017-07-07 MED ORDER — PANTOPRAZOLE SODIUM 40 MG IV SOLR
40.0000 mg | Freq: Once | INTRAVENOUS | Status: AC
  Administered 2017-07-07: 40 mg via INTRAVENOUS
  Filled 2017-07-07: qty 40

## 2017-07-07 MED ORDER — DEXTROSE 5 % IV SOLN
1.0000 g | Freq: Once | INTRAVENOUS | Status: AC
  Administered 2017-07-07: 1 g via INTRAVENOUS
  Filled 2017-07-07: qty 10

## 2017-07-07 MED ORDER — SODIUM CHLORIDE 0.9 % IV BOLUS (SEPSIS)
1000.0000 mL | Freq: Once | INTRAVENOUS | Status: AC
  Administered 2017-07-08: 1000 mL via INTRAVENOUS

## 2017-07-07 MED ORDER — MORPHINE SULFATE (PF) 4 MG/ML IV SOLN
4.0000 mg | Freq: Once | INTRAVENOUS | Status: AC
  Administered 2017-07-08: 4 mg via INTRAVENOUS
  Filled 2017-07-07: qty 1

## 2017-07-07 MED ORDER — IOPAMIDOL (ISOVUE-300) INJECTION 61%
INTRAVENOUS | Status: AC
  Administered 2017-07-07: 100 mL
  Filled 2017-07-07: qty 100

## 2017-07-07 MED ORDER — IPRATROPIUM-ALBUTEROL 0.5-2.5 (3) MG/3ML IN SOLN
3.0000 mL | Freq: Once | RESPIRATORY_TRACT | Status: DC
  Filled 2017-07-07 (×2): qty 3

## 2017-07-07 MED ORDER — ONDANSETRON HCL 4 MG/2ML IJ SOLN
4.0000 mg | Freq: Once | INTRAMUSCULAR | Status: AC
  Administered 2017-07-08: 4 mg via INTRAVENOUS
  Filled 2017-07-07: qty 2

## 2017-07-07 MED ORDER — SODIUM CHLORIDE 0.9 % IV SOLN
Freq: Once | INTRAVENOUS | Status: AC
  Administered 2017-07-07: 17:00:00 via INTRAVENOUS
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
## 2017-07-07 NOTE — ED Notes (Signed)
ED Provider at bedside. 

## 2017-07-07 NOTE — ED Triage Notes (Signed)
Patient complains of acid coming into throat for several days with epigastric pain, also concerned that his urine has looked dark. No emesis, no diarrhea . Alert and oriented, NAD
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
## 2017-07-07 NOTE — ED Notes (Signed)
Patient transported to X-ray 

## 2017-07-07 NOTE — ED Notes (Signed)
Patient transported to CT 

## 2017-07-07 NOTE — ED Provider Notes (Addendum)
6:31 PM  CT shows signs c/w cholecystitis- d/w Dr. Grandville Silos, surgery- they will consult.  Calling hospitalist now for admission.  Will likely need ERCP.    6:46 PM  D/w family medicine resident for admission.  They will see patient in the ED and consult GI.    8:07 PM pt has been seen by both family medicine resident and surgery- pt wants to leave AMA.  They have both discussed risks of leaving in detail with him- he continues to state he wishes to leave without further treatment- wants to return to the hospital at another time for treatment.     Alfonzo Beers, MD 07/07/17 Dyann Ruddle    Alfonzo Beers, MD 07/07/17 2007

## 2017-07-07 NOTE — ED Triage Notes (Signed)
The pt was just discharged from here in the ed.  He drove his truck home and came back he reports that he has more severe pain with vomiting
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
## 2017-07-07 NOTE — ED Notes (Signed)
Pt wanting to leave. MD has tried to talk to him but he stated he needed to get home and take care of things and then come back.

## 2017-07-08 ENCOUNTER — Inpatient Hospital Stay (HOSPITAL_COMMUNITY): Payer: Medicare Other

## 2017-07-08 DIAGNOSIS — K219 Gastro-esophageal reflux disease without esophagitis: Secondary | ICD-10-CM | POA: Diagnosis not present

## 2017-07-08 DIAGNOSIS — R945 Abnormal results of liver function studies: Secondary | ICD-10-CM | POA: Diagnosis not present

## 2017-07-08 DIAGNOSIS — B9620 Unspecified Escherichia coli [E. coli] as the cause of diseases classified elsewhere: Secondary | ICD-10-CM | POA: Diagnosis present

## 2017-07-08 DIAGNOSIS — R109 Unspecified abdominal pain: Secondary | ICD-10-CM | POA: Diagnosis not present

## 2017-07-08 DIAGNOSIS — I25110 Atherosclerotic heart disease of native coronary artery with unstable angina pectoris: Secondary | ICD-10-CM | POA: Diagnosis not present

## 2017-07-08 DIAGNOSIS — K8012 Calculus of gallbladder with acute and chronic cholecystitis without obstruction: Secondary | ICD-10-CM | POA: Diagnosis not present

## 2017-07-08 DIAGNOSIS — R1013 Epigastric pain: Secondary | ICD-10-CM | POA: Diagnosis not present

## 2017-07-08 DIAGNOSIS — F17210 Nicotine dependence, cigarettes, uncomplicated: Secondary | ICD-10-CM | POA: Diagnosis present

## 2017-07-08 DIAGNOSIS — G8929 Other chronic pain: Secondary | ICD-10-CM | POA: Diagnosis present

## 2017-07-08 DIAGNOSIS — I2510 Atherosclerotic heart disease of native coronary artery without angina pectoris: Secondary | ICD-10-CM | POA: Diagnosis not present

## 2017-07-08 DIAGNOSIS — J64 Unspecified pneumoconiosis: Secondary | ICD-10-CM | POA: Diagnosis present

## 2017-07-08 DIAGNOSIS — K851 Biliary acute pancreatitis without necrosis or infection: Secondary | ICD-10-CM | POA: Diagnosis present

## 2017-07-08 DIAGNOSIS — J449 Chronic obstructive pulmonary disease, unspecified: Secondary | ICD-10-CM | POA: Diagnosis not present

## 2017-07-08 DIAGNOSIS — F40240 Claustrophobia: Secondary | ICD-10-CM | POA: Diagnosis present

## 2017-07-08 DIAGNOSIS — Z888 Allergy status to other drugs, medicaments and biological substances status: Secondary | ICD-10-CM | POA: Diagnosis not present

## 2017-07-08 DIAGNOSIS — K810 Acute cholecystitis: Secondary | ICD-10-CM | POA: Diagnosis not present

## 2017-07-08 DIAGNOSIS — Z01810 Encounter for preprocedural cardiovascular examination: Secondary | ICD-10-CM | POA: Diagnosis not present

## 2017-07-08 DIAGNOSIS — R079 Chest pain, unspecified: Secondary | ICD-10-CM | POA: Diagnosis not present

## 2017-07-08 DIAGNOSIS — I099 Rheumatic heart disease, unspecified: Secondary | ICD-10-CM | POA: Diagnosis present

## 2017-07-08 DIAGNOSIS — Z79899 Other long term (current) drug therapy: Secondary | ICD-10-CM | POA: Diagnosis not present

## 2017-07-08 DIAGNOSIS — K8066 Calculus of gallbladder and bile duct with acute and chronic cholecystitis without obstruction: Secondary | ICD-10-CM | POA: Diagnosis present

## 2017-07-08 DIAGNOSIS — F17200 Nicotine dependence, unspecified, uncomplicated: Secondary | ICD-10-CM | POA: Diagnosis not present

## 2017-07-08 DIAGNOSIS — K8010 Calculus of gallbladder with chronic cholecystitis without obstruction: Secondary | ICD-10-CM | POA: Diagnosis not present

## 2017-07-08 DIAGNOSIS — R9439 Abnormal result of other cardiovascular function study: Secondary | ICD-10-CM | POA: Diagnosis not present

## 2017-07-08 DIAGNOSIS — R1084 Generalized abdominal pain: Secondary | ICD-10-CM

## 2017-07-08 DIAGNOSIS — I5033 Acute on chronic diastolic (congestive) heart failure: Secondary | ICD-10-CM | POA: Diagnosis present

## 2017-07-08 DIAGNOSIS — Z955 Presence of coronary angioplasty implant and graft: Secondary | ICD-10-CM | POA: Diagnosis not present

## 2017-07-08 DIAGNOSIS — Z7982 Long term (current) use of aspirin: Secondary | ICD-10-CM | POA: Diagnosis not present

## 2017-07-08 DIAGNOSIS — Z91018 Allergy to other foods: Secondary | ICD-10-CM | POA: Diagnosis not present

## 2017-07-08 DIAGNOSIS — I110 Hypertensive heart disease with heart failure: Secondary | ICD-10-CM | POA: Diagnosis present

## 2017-07-08 LAB — CBC
HCT: 44.2 % (ref 39.0–52.0)
Hemoglobin: 15.3 g/dL (ref 13.0–17.0)
MCH: 31.4 pg (ref 26.0–34.0)
MCHC: 34.6 g/dL (ref 30.0–36.0)
MCV: 90.8 fL (ref 78.0–100.0)
PLATELETS: 169 10*3/uL (ref 150–400)
RBC: 4.87 MIL/uL (ref 4.22–5.81)
RDW: 15.3 % (ref 11.5–15.5)
WBC: 11.8 10*3/uL — ABNORMAL HIGH (ref 4.0–10.5)

## 2017-07-08 LAB — COMPREHENSIVE METABOLIC PANEL
ALBUMIN: 3.8 g/dL (ref 3.5–5.0)
ALT: 591 U/L — AB (ref 17–63)
AST: 185 U/L — AB (ref 15–41)
Alkaline Phosphatase: 186 U/L — ABNORMAL HIGH (ref 38–126)
Anion gap: 8 (ref 5–15)
BUN: 11 mg/dL (ref 6–20)
CHLORIDE: 107 mmol/L (ref 101–111)
CO2: 24 mmol/L (ref 22–32)
CREATININE: 0.91 mg/dL (ref 0.61–1.24)
Calcium: 8.8 mg/dL — ABNORMAL LOW (ref 8.9–10.3)
GFR calc Af Amer: >60 mL/min (ref >60)
GLUCOSE: 134 mg/dL — AB (ref 65–99)
Potassium: 4.6 mmol/L (ref 3.5–5.1)
SODIUM: 139 mmol/L (ref 135–145)
Total Bilirubin: 1.6 mg/dL — ABNORMAL HIGH (ref 0.3–1.2)
Total Protein: 7.4 g/dL (ref 6.5–8.1)

## 2017-07-08 LAB — PROTIME-INR
INR: 0.97
Prothrombin Time: 12.9 seconds (ref 11.4–15.2)

## 2017-07-08 LAB — LIPASE, BLOOD
Lipase: 5890 U/L — ABNORMAL HIGH (ref 11–51)
Lipase: 870 U/L — ABNORMAL HIGH (ref 11–51)

## 2017-07-08 MED ORDER — ORAL CARE MOUTH RINSE
15.0000 mL | Freq: Two times a day (BID) | OROMUCOSAL | Status: DC
  Administered 2017-07-08 – 2017-07-10 (×4): 15 mL via OROMUCOSAL

## 2017-07-08 MED ORDER — MORPHINE SULFATE (PF) 4 MG/ML IV SOLN
2.0000 mg | INTRAVENOUS | Status: DC | PRN
  Administered 2017-07-08 – 2017-07-12 (×6): 4 mg via INTRAVENOUS
  Filled 2017-07-08 (×6): qty 1

## 2017-07-08 MED ORDER — IPRATROPIUM-ALBUTEROL 0.5-2.5 (3) MG/3ML IN SOLN: 3.0000 mL | RESPIRATORY_TRACT | Status: DC | PRN

## 2017-07-08 MED ORDER — ONDANSETRON HCL 4 MG PO TABS: 4.0000 mg | ORAL_TABLET | Freq: Four times a day (QID) | ORAL | Status: DC | PRN

## 2017-07-08 MED ORDER — SODIUM CHLORIDE 0.9 % IV SOLN
INTRAVENOUS | Status: DC
  Administered 2017-07-08 – 2017-07-13 (×10): via INTRAVENOUS

## 2017-07-08 MED ORDER — ASPIRIN EC 81 MG PO TBEC
81.0000 mg | DELAYED_RELEASE_TABLET | Freq: Every day | ORAL | Status: DC
  Administered 2017-07-08 – 2017-07-13 (×6): 81 mg via ORAL
  Filled 2017-07-08 (×6): qty 1

## 2017-07-08 MED ORDER — PIPERACILLIN-TAZOBACTAM 3.375 G IVPB
3.3750 g | Freq: Three times a day (TID) | INTRAVENOUS | Status: DC
  Administered 2017-07-08 – 2017-07-13 (×17): 3.375 g via INTRAVENOUS
  Filled 2017-07-08 (×17): qty 50

## 2017-07-08 MED ORDER — ONDANSETRON HCL 4 MG/2ML IJ SOLN
4.0000 mg | Freq: Four times a day (QID) | INTRAMUSCULAR | Status: DC | PRN
  Administered 2017-07-08: 4 mg via INTRAVENOUS
  Filled 2017-07-08: qty 2

## 2017-07-08 NOTE — ED Notes (Signed)
Re-paged GI doc
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
## 2017-07-08 NOTE — ED Notes (Addendum)
Pt's SpO2 saturations dropping into the low 90s when pt is resting. This RN placed pt on 2L [place] but pt said he could not tolerate oxygen right now. [place] taken off and will continue to monitor.

## 2017-07-08 NOTE — Progress Notes (Signed)
[facility] Progress Note      Assessment/Plan Principal Problem:   Gallstone pancreatitis Active Problems:   TOBACCO ABUSE   COPD (chronic obstructive pulmonary disease) ([facility])   Coronary atherosclerosis of native coronary artery   Acute cholecystitis   Abdominal pain  Cholecystitis with cholelithiasis likely choledocholithiasis Likely gallstone Pancreatitis - recommend GI consult for ERCP - LFT's and Tbili improving, lipase pending - bowel rest with IVF - IV Zosyn  DISPO: Lipase pending, surgery likely after GI consult and possible ERCP    LOS: 0 days    Subjective:  CC; abdominal pain  Pt states pain is worse than it was the first time he came to the ED. Patient states between his ER visits he did have emesis. Last normal bowel movement was yesterday. No new complaints. Patient states he's never had abdominal surgeries.  Objective: Vital signs in last 24 hours: Temp:  [97.6 F (36.4 C)-98.4 F (36.9 C)] 97.8 F (36.6 C) (07/22 0630) Pulse Rate:  [42-64] 64 (07/22 0630) Resp:  [12-29] 18 (07/22 0630) BP: (105-166)/(63-90) 134/71 (07/22 0630) SpO2:  [91 %-100 %] 93 % (07/22 0630) Weight:  [204 lb 12.9 oz (92.9 kg)-210 lb (95.3 kg)] 204 lb 12.9 oz (92.9 kg) (07/22 0222) Last BM Date: 07/07/17  Intake/Output from previous day: No intake/output data recorded. Intake/Output this shift: Total I/O In: -  Out: 400 [Urine:400]  PE: Gen:  Alert, NAD, pleasant, cooperative Card:  RRR, no M/G/R heard Pulm:  CTA anteriorly, no W/R/R, effort normal Abd: Soft, not distended, +BS, no hernias, generalized TTP worse in the epigastrium and right upper quadrant  Skin: no rashes noted, warm and dry   Anti-infectives: Anti-infectives    Start     Dose/Rate Route Frequency Ordered Stop   07/08/17 0200  piperacillin-tazobactam (ZOSYN) IVPB 3.375 g     3.375 g 12.5 mL/hr over 240 Minutes Intravenous Every 8 hours 07/08/17 0120        Lab Results:    Recent Labs  07/07/17 2156 07/08/17 0534  WBC 12.8* 11.8*  HGB 15.5 15.3  HCT 46.3 44.2  PLT 178 169   BMET  Recent Labs  07/07/17 2156 07/08/17 0534  NA 137 139  K 3.9 4.6  CL 105 107  CO2 21* 24  GLUCOSE 121* 134*  BUN 10 11  CREATININE 1.02 0.91  CALCIUM 9.0 8.8*   PT/INR No results for input(s): LABPROT, INR in the last 72 hours. CMP     Component Value Date/Time   NA 139 07/08/2017 0534   K 4.6 07/08/2017 0534   CL 107 07/08/2017 0534   CO2 24 07/08/2017 0534   GLUCOSE 134 (H) 07/08/2017 0534   BUN 11 07/08/2017 0534   CREATININE 0.91 07/08/2017 0534   CALCIUM 8.8 (L) 07/08/2017 0534   PROT 7.4 07/08/2017 0534   ALBUMIN 3.8 07/08/2017 0534   AST 185 (H) 07/08/2017 0534   ALT 591 (H) 07/08/2017 0534   ALKPHOS 186 (H) 07/08/2017 0534   BILITOT 1.6 (H) 07/08/2017 0534   GFRNONAA >60 07/08/2017 0534   GFRAA >60 07/08/2017 0534   Lipase     Component Value Date/Time   LIPASE 5,890 (H) 07/07/2017 2156    Studies/Results: Dg Chest 2 View  Result Date: 07/07/2017 CLINICAL DATA:  Epigastric pain with cough EXAM: CHEST  2 VIEW COMPARISON:  10/04/2013 FINDINGS: Hyperinflation with emphysematous disease. Linear scarring or atelectasis at both lung bases. No focal consolidation or pleural effusion. Normal heart size. No pneumothorax.  Mild wedging of midthoracic vertebra. IMPRESSION: 1. Hyperinflation with emphysematous disease. 2. Linear scarring or atelectasis within both lung bases. 3. No focal pulmonary infiltrate is seen. Electronically Signed   By: Donavan Foil M.D.   On: 07/07/2017 16:41   Ct Abdomen Pelvis W Contrast  Result Date: 07/07/2017 CLINICAL DATA:  Epigastric pain. EXAM: CT ABDOMEN AND PELVIS WITH CONTRAST TECHNIQUE: Multidetector CT imaging of the abdomen and pelvis was performed using the standard protocol following bolus administration of intravenous contrast. CONTRAST:  144mL ISOVUE-300 IOPAMIDOL (ISOVUE-300) INJECTION 61% COMPARISON:  None.  FINDINGS: Lower chest: Emphysema noted in both lower lungs with areas of architectural distortion and scarring. Hepatobiliary: No focal abnormality within the liver parenchyma. 2 cm noncalcified stone is identified in the gallbladder fundus. There is also a 7-8 mm calcified gallstone. Subtle pericholecystic fluid and/or gallbladder wall thickening is evident. Common bile duct measures 10 mm diameter in the head of pancreas. Pancreas: No focal mass lesion. No dilatation of the main duct. No intraparenchymal cyst. No peripancreatic edema. Spleen: No splenomegaly. No focal mass lesion. Adrenals/Urinary Tract: No adrenal nodule or mass. 8 mm hypoattenuating subcapsular lesion in the interpolar left kidney is too small to characterize but likely a cyst. Right kidney unremarkable. No evidence for hydroureter. The urinary bladder appears normal for the degree of distention. Stomach/Bowel: Stomach is nondistended. No gastric wall thickening. No evidence of outlet obstruction. Duodenum is normally positioned as is the ligament of Treitz. No small bowel wall thickening. No small bowel dilatation. The terminal ileum is normal. The appendix is normal. Diverticular changes are noted in the left colon without evidence of diverticulitis. Vascular/Lymphatic: There is abdominal aortic atherosclerosis without aneurysm. There is no gastrohepatic or hepatoduodenal ligament lymphadenopathy. No intraperitoneal or retroperitoneal lymphadenopathy. No pelvic sidewall lymphadenopathy. Reproductive: The prostate gland and seminal vesicles have normal imaging features. Other: No intraperitoneal free fluid. Musculoskeletal: A left-sided Spigelian hernia is identified, containing only fat and dissecting into the space between the internal and external oblique muscles with hernia sac measuring 2.4 x 6.7 x 5.9 cm. Bone windows reveal no worrisome lytic or sclerotic osseous lesions. IMPRESSION: 1. Gallstones with CT findings suggesting  pericholecystic fluid and/or gallbladder wall thickening. Mild intra and extrahepatic biliary duct dilatation is associated. Acute cholecystitis is a concern by CT. 2. Left-sided Spigelian abdominal wall hernia containing only fat which dissects into the space between the internal and external oblique muscles. Electronically Signed   By: Misty Stanley M.D.   On: 07/07/2017 17:59      Kalman Drape , [facility] 07/08/2017, 10:09 AM Pager: [phone] Consults: [phone] Mon-Fri 7:00 am-4:30 pm Sat-Sun 7:00 am-11:30 am

## 2017-07-08 NOTE — Progress Notes (Signed)
PROGRESS NOTE    John Forbes  TSV:[id number] DOB: 1961/06/09 DOA: 07/07/2017 PCP: Patient, [name]  Outpatient Specialists:     Brief Narrative:  70 ? UA + in-stent re-stenosis PDA stent + PCI 06/26/13  prior CAD  DES-->PDA 2.5 x 16 mm Rh Fever COPD + emphysema, class 2 dyspnea   PFTs 10/21/2009 (FEV1/FVC 60%; FEV1 3.09/83%, FVC 5.11/101%, Small airway 57%, DLCO 21.23/63% Caustic lung injury [ammonia gas]  Quit smoking 04/2010  functional status-can walk 2 blocks Hiatal hernia    a.  EGD 08/03/2010:  mild stricture; mild Grade 2 esophagitis; s/p dilatation; stomach normal; needs h. pylori test if symptoms persist  Colonic polyps, hx of    a.  colo 08/03/2010:  1.5 cm polyp removed; path    b.  colo in 07/2011 Chr LBP On disability  Admitted with severe abd pain after leaving AMA from Ed 7/21 Has GB stone pancreatitis vs CBD stone GI and gen surg consulted     Assessment & Plan:   Principal Problem:   Gallstone pancreatitis Active Problems:   TOBACCO ABUSE   COPD (chronic obstructive pulmonary disease) (HCC)   Coronary atherosclerosis of native coronary artery   Acute cholecystitis   Abdominal pain   Abdominal pain 2/2 gallstone pancreatitis with cholecystitis-BISAP score <2 Transaminitis: Acute. AST 292, ALT 685, AP 180, and total bilirubin 4.4.---now 1.6. 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 WBC elevated at 12.8.   - Empiric antibiotics of Zosyn started Likely needs ERCP-claustrophobic and will not do MRCP Await cool down and eventual surgery  Urinary tract infection:  Not clean catch Disregard UA  Coronary artery disease status post cardiac stent   ASa held on admit-resumed  COPD without acute exacerbation Ammoniacal  pneumoconiosis Continued smoker Grade 2 dyspnea Tobacco abuse: Patient reports smoking 1-2 packs of cigarettes per day on average. He declines nicotine patch offered. - Counseled on the need for cessation of tobacco use.  Chr LBP  Holding meds, can give IV morphine   Uncontrolled HTN    scd Full code No family inpatient   Consultants:   Gi  gen surg  Procedures:   None yet  Antimicrobials:   Zosyn     Subjective:  Pain when moving otehrwise fine No fever no chills no cp Npo   Objective: Vitals:   07/08/17 0030 07/08/17 0130 07/08/17 0222 07/08/17 0630  BP: (!) 161/74 135/63 (!) 153/79 134/71  Pulse: (!) 42 (!) 51 (!) 53 64  Resp: 18 17 17 18   Temp:   98.1 F (36.7 C) 97.8 F (36.6 C)  TempSrc:   Oral   SpO2: 93% 92% 92% 93%  Weight:   92.9 kg (204 lb 12.9 oz)   Height:   6' (1.829 m)     Intake/Output Summary (Last 24 hours) at 07/08/17 0753 Last data filed at 07/08/17 0654  Gross per 24 hour  Intake                0 ml  Output                0 ml  Net                0 ml   [employer]  07/07/17 2155 07/08/17 0222  Weight: 95.3 kg (210 lb) 92.9 kg (204 lb 12.9 oz)    Examination:  Pleasant talkative-chatted about engines and motorbikes eomi ncat no ict abd soft-epig tedner No le edema s1 s 2no m/r/g Neuro intact multiple tattos  Data Reviewed: I have personally reviewed following labs and imaging studies  CBC:  Recent Labs Lab 07/07/17 1509 07/07/17 2156 07/08/17 0534  WBC 6.4 12.8* 11.8*  HGB 14.7 15.5 15.3  HCT 44.4 46.3 44.2  MCV 91.7 91.1 90.8  PLT 178 178 790   Basic Metabolic Panel:  Recent Labs Lab 07/07/17 1509 07/07/17 2156 07/08/17 0534  NA 135 137 139  K 4.3 3.9 4.6  CL 105 105 107  CO2 22 21* 24  GLUCOSE 95 121* 134*  BUN 11 10 11   CREATININE 0.90 1.02 0.91  CALCIUM 8.9 9.0 8.8*   GFR: Estimated Creatinine Clearance: 99.5 mL/min (by C-G formula based on SCr of 0.91 mg/dL). Liver Function  Tests:  Recent Labs Lab 07/07/17 1509 07/07/17 2156 07/08/17 0534  AST 341* 292* 185*  ALT 738* 685* 591*  ALKPHOS 170* 180* 186*  BILITOT 4.1* 4.4* 1.6*  PROT 6.9 7.4 7.4  ALBUMIN 3.8 4.0 3.8    Recent Labs Lab 07/07/17 1509 07/07/17 2156  LIPASE 27 5,890*   No results for input(s): AMMONIA in the last 168 hours. Coagulation Profile: No results for input(s): INR, PROTIME in the last 168 hours. Cardiac Enzymes: No results for input(s): CKTOTAL, CKMB, CKMBINDEX, TROPONINI in the last 168 hours. BNP (last 3 results) No results for input(s): PROBNP in the last 8760 hours. HbA1C: No results for input(s): HGBA1C in the last 72 hours. CBG: No results for input(s): GLUCAP in the last 168 hours. Lipid Profile: No results for input(s): CHOL, HDL, LDLCALC, TRIG, CHOLHDL, LDLDIRECT in the last 72 hours. Thyroid Function Tests: No results for input(s): TSH, T4TOTAL, FREET4, T3FREE, THYROIDAB in the last 72 hours. Anemia Panel: No results for input(s): VITAMINB12, FOLATE, FERRITIN, TIBC, IRON, RETICCTPCT in the last 72 hours. Urine analysis:    Component Value Date/Time   COLORURINE AMBER (A) 07/07/2017 1550   APPEARANCEUR CLEAR 07/07/2017 1550   LABSPEC 1.005 07/07/2017 1550   PHURINE 7.0 07/07/2017 1550   GLUCOSEU NEGATIVE 07/07/2017 1550   HGBUR NEGATIVE 07/07/2017 1550   BILIRUBINUR NEGATIVE 07/07/2017 1550   KETONESUR NEGATIVE 07/07/2017 1550   PROTEINUR NEGATIVE 07/07/2017 1550   NITRITE POSITIVE (A) 07/07/2017 1550   LEUKOCYTESUR SMALL (A) 07/07/2017 1550   Sepsis Labs: @LABRCNTIP (procalcitonin:4,lacticidven:4)  )No results found for this or any previous visit (from the past 240 hour(s)).       Radiology Studies: Dg Chest 2 View  Result Date: 07/07/2017 CLINICAL DATA:  Epigastric pain with cough EXAM: CHEST  2 VIEW COMPARISON:  10/04/2013 FINDINGS: Hyperinflation with emphysematous disease. Linear scarring or atelectasis at both lung bases. No focal  consolidation or pleural effusion. Normal heart size. No pneumothorax. Mild wedging of midthoracic vertebra. IMPRESSION: 1. Hyperinflation with emphysematous disease. 2. Linear scarring or atelectasis within both lung bases. 3. No focal pulmonary infiltrate is seen. Electronically Signed   By: Donavan Foil M.D.   On: 07/07/2017 16:41   Ct Abdomen Pelvis W Contrast  Result Date: 07/07/2017 CLINICAL DATA:  Epigastric pain. EXAM: CT ABDOMEN AND PELVIS WITH CONTRAST TECHNIQUE: Multidetector CT imaging of the abdomen and pelvis was performed using the standard protocol following bolus administration of intravenous contrast. CONTRAST:  119mL ISOVUE-300 IOPAMIDOL (ISOVUE-300) INJECTION 61% COMPARISON:  None.  FINDINGS: Lower chest: Emphysema noted in both lower lungs with areas of architectural distortion and scarring. Hepatobiliary: No focal abnormality within the liver parenchyma. 2 cm noncalcified stone is identified in the gallbladder fundus. There is also a 7-8 mm calcified gallstone. Subtle pericholecystic fluid and/or gallbladder wall thickening is evident. Common bile duct measures 10 mm diameter in the head of pancreas. Pancreas: No focal mass lesion. No dilatation of the main duct. No intraparenchymal cyst. No peripancreatic edema. Spleen: No splenomegaly. No focal mass lesion. Adrenals/Urinary Tract: No adrenal nodule or mass. 8 mm hypoattenuating subcapsular lesion in the interpolar left kidney is too small to characterize but likely a cyst. Right kidney unremarkable. No evidence for hydroureter. The urinary bladder appears normal for the degree of distention. Stomach/Bowel: Stomach is nondistended. No gastric wall thickening. No evidence of outlet obstruction. Duodenum is normally positioned as is the ligament of Treitz. No small bowel wall thickening. No small bowel dilatation. The terminal ileum is normal. The appendix is normal. Diverticular changes are noted in the left colon without evidence of  diverticulitis. Vascular/Lymphatic: There is abdominal aortic atherosclerosis without aneurysm. There is no gastrohepatic or hepatoduodenal ligament lymphadenopathy. No intraperitoneal or retroperitoneal lymphadenopathy. No pelvic sidewall lymphadenopathy. Reproductive: The prostate gland and seminal vesicles have normal imaging features. Other: No intraperitoneal free fluid. Musculoskeletal: A left-sided Spigelian hernia is identified, containing only fat and dissecting into the space between the internal and external oblique muscles with hernia sac measuring 2.4 x 6.7 x 5.9 cm. Bone windows reveal no worrisome lytic or sclerotic osseous lesions. IMPRESSION: 1. Gallstones with CT findings suggesting pericholecystic fluid and/or gallbladder wall thickening. Mild intra and extrahepatic biliary duct dilatation is associated. Acute cholecystitis is a concern by CT. 2. Left-sided Spigelian abdominal wall hernia containing only fat which dissects into the space between the internal and external oblique muscles. Electronically Signed   By: Misty Stanley M.D.   On: 07/07/2017 17:59        Scheduled Meds: Continuous Infusions: . sodium chloride 125 mL/hr at 07/08/17 0542  . piperacillin-tazobactam (ZOSYN)  IV 3.375 g (07/08/17 0333)     LOS: 0 days    Time spent: [place], MD [facility]   If 7PM-7AM, please contact night-coverage [email or web address] Password [facility] 07/08/2017, 7:53 AM

## 2017-07-08 NOTE — Progress Notes (Signed)
OK'd with Dr. Verlon Au for pt to go down to MRI without telemetry.  He currently is in sinus rhythm at a rate of 63.
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
## 2017-07-09 ENCOUNTER — Encounter (HOSPITAL_COMMUNITY): Payer: Self-pay | Admitting: General Practice

## 2017-07-09 ENCOUNTER — Ambulatory Visit: Payer: Self-pay | Admitting: Surgery

## 2017-07-09 ENCOUNTER — Inpatient Hospital Stay (HOSPITAL_COMMUNITY): Payer: Medicare Other

## 2017-07-09 DIAGNOSIS — K851 Biliary acute pancreatitis without necrosis or infection: Principal | ICD-10-CM

## 2017-07-09 DIAGNOSIS — R7989 Other specified abnormal findings of blood chemistry: Secondary | ICD-10-CM

## 2017-07-09 DIAGNOSIS — I25110 Atherosclerotic heart disease of native coronary artery with unstable angina pectoris: Secondary | ICD-10-CM

## 2017-07-09 DIAGNOSIS — R945 Abnormal results of liver function studies: Secondary | ICD-10-CM

## 2017-07-09 DIAGNOSIS — I2510 Atherosclerotic heart disease of native coronary artery without angina pectoris: Secondary | ICD-10-CM

## 2017-07-09 DIAGNOSIS — R079 Chest pain, unspecified: Secondary | ICD-10-CM | POA: Diagnosis present

## 2017-07-09 LAB — URINE CULTURE: Culture: >=100000 — AB

## 2017-07-09 LAB — COMPREHENSIVE METABOLIC PANEL
ALBUMIN: 3.3 g/dL — AB (ref 3.5–5.0)
ALT: 319 U/L — ABNORMAL HIGH (ref 17–63)
ANION GAP: 7 (ref 5–15)
AST: 58 U/L — ABNORMAL HIGH (ref 15–41)
Alkaline Phosphatase: 141 U/L — ABNORMAL HIGH (ref 38–126)
BILIRUBIN TOTAL: 1.5 mg/dL — AB (ref 0.3–1.2)
BUN: 11 mg/dL (ref 6–20)
CO2: 23 mmol/L (ref 22–32)
Calcium: 8.4 mg/dL — ABNORMAL LOW (ref 8.9–10.3)
Chloride: 108 mmol/L (ref 101–111)
Creatinine, Ser: 0.92 mg/dL (ref 0.61–1.24)
GFR calc Af Amer: >60 mL/min (ref >60)
GFR calc non Af Amer: >60 mL/min (ref >60)
GLUCOSE: 99 mg/dL (ref 65–99)
POTASSIUM: 3.9 mmol/L (ref 3.5–5.1)
SODIUM: 138 mmol/L (ref 135–145)
TOTAL PROTEIN: 6 g/dL — AB (ref 6.5–8.1)

## 2017-07-09 LAB — ECHOCARDIOGRAM COMPLETE
HEIGHTINCHES: 72 in
WEIGHTICAEL: 3276.92 [oz_av]

## 2017-07-09 LAB — LIPASE, BLOOD: Lipase: 90 U/L — ABNORMAL HIGH (ref 11–51)

## 2017-07-09 LAB — LIPID PANEL
CHOLESTEROL: 167 mg/dL (ref 0–200)
HDL: 34 mg/dL — ABNORMAL LOW (ref >40)
LDL Cholesterol: 110 mg/dL — ABNORMAL HIGH (ref 0–99)
Total CHOL/HDL Ratio: 4.9 RATIO
Triglycerides: 115 mg/dL (ref <150)
VLDL: 23 mg/dL (ref 0–40)

## 2017-07-09 MED ORDER — FAMOTIDINE IN NACL 20-0.9 MG/50ML-% IV SOLN
20.0000 mg | Freq: Two times a day (BID) | INTRAVENOUS | Status: DC
  Administered 2017-07-09 – 2017-07-13 (×9): 20 mg via INTRAVENOUS
  Filled 2017-07-09 (×10): qty 50

## 2017-07-09 MED ORDER — CHLORHEXIDINE GLUCONATE 4 % EX LIQD: 60.0000 mL | Freq: Once | CUTANEOUS | Status: DC

## 2017-07-09 MED ORDER — METOPROLOL TARTRATE 12.5 MG HALF TABLET
12.5000 mg | ORAL_TABLET | Freq: Two times a day (BID) | ORAL | Status: DC
  Administered 2017-07-09 – 2017-07-10 (×3): 12.5 mg via ORAL
  Filled 2017-07-09 (×3): qty 1

## 2017-07-09 MED ORDER — ISOSORBIDE MONONITRATE ER 30 MG PO TB24
30.0000 mg | ORAL_TABLET | Freq: Every day | ORAL | Status: DC
  Administered 2017-07-09 – 2017-07-13 (×5): 30 mg via ORAL
  Filled 2017-07-09 (×5): qty 1

## 2017-07-09 MED ORDER — CHLORHEXIDINE GLUCONATE 4 % EX LIQD
60.0000 mL | Freq: Once | CUTANEOUS | Status: AC
  Administered 2017-07-09: 4 via TOPICAL
  Filled 2017-07-09: qty 60
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
## 2017-07-09 NOTE — Progress Notes (Signed)
CC:  Abdominal pain  Subjective: Alert, pain is better but he is still tender mid abdomen/Epigastric area.    Objective: Vital signs in last 24 hours: Temp:  [97.7 F (36.5 C)-98.8 F (37.1 C)] 98.8 F (37.1 C) (07/23 0624) Pulse Rate:  [63-70] 70 (07/23 0624) Resp:  [18] 18 (07/23 0624) BP: (121-132)/(66-73) 132/73 (07/23 0624) SpO2:  [92 %] 92 % (07/23 0624) Last BM Date: 07/07/17 NPO 1275 IV 2600 urine Afebrile, VSS LFT's/lipase better Component 2d ago - 07/07/17  Specimen Description URINE, RANDOM   Special Requests NONE   Culture >=100,000 COLONIES/mL ESCHERICHIA COLI    Report Status PENDING   Resulting Agency [facility]     Intake/Output from previous day: 07/22 0701 - 07/23 0700 In: 1275 [I.V.:1175; IV Piggyback:100] Out: 2600 [Urine:2600] Intake/Output this shift: No intake/output data recorded.  General appearance: alert, cooperative and no distress Resp: clear to auscultation bilaterally and chronic cough, no current sputum production.  Lungs are clear on ascultation. GI: soft, still tender mid epigastric area, he says it is currently much better than when he came in.    Lab Results:   Recent Labs  07/07/17 2156 07/08/17 0534  WBC 12.8* 11.8*  HGB 15.5 15.3  HCT 46.3 44.2  PLT 178 169    BMET  Recent Labs  07/08/17 0534 07/09/17 0616  NA 139 138  K 4.6 3.9  CL 107 108  CO2 24 23  GLUCOSE 134* 99  BUN 11 11  CREATININE 0.91 0.92  CALCIUM 8.8* 8.4*   PT/INR  Recent Labs  07/08/17 1034  LABPROT 12.9  INR 0.97     Recent Labs Lab 07/07/17 1509 07/07/17 2156 07/08/17 0534 07/09/17 0616  AST 341* 292* 185* 58*  ALT 738* 685* 591* 319*  ALKPHOS 170* 180* 186* 141*  BILITOT 4.1* 4.4* 1.6* 1.5*  PROT 6.9 7.4 7.4 6.0*  ALBUMIN 3.8 4.0 3.8 3.3*     Lipase     Component Value Date/Time   LIPASE 90 (H) 07/09/2017 0616     Prior to Admission medications   Medication Sig Start Date End Date Taking? Authorizing  Provider  albuterol (PROVENTIL) (2.5 MG/3ML) 0.083% nebulizer solution Take 3 mLs (2.5 mg total) by nebulization every 6 (six) hours as needed for wheezing. 09/08/13  Yes Theodis Blaze, MD  aspirin EC 81 MG EC tablet Take 1 tablet (81 mg total) by mouth daily. 06/27/13  Yes Turner, Eber Hong, MD  cyclobenzaprine (FLEXERIL) 10 MG tablet Take 1 tablet (10 mg total) by mouth 3 (three) times daily as needed for muscle spasms. 12/24/13  Yes Advani, Vernon Prey, MD  ibuprofen (ADVIL,MOTRIN) 200 MG tablet Take 200-400 mg by mouth every 6 (six) hours as needed (for headaches).   Yes [provider]  nitroGLYCERIN (NITROSTAT) 0.4 MG SL tablet Place 1 tablet (0.4 mg total) under the tongue every 5 (five) minutes as needed for chest pain. 06/27/13  Yes Turner, Eber Hong, MD  ranitidine (ZANTAC) 150 MG tablet Take 1 tablet (150 mg total) by mouth 2 (two) times daily. Patient taking differently: Take 150 mg by mouth 2 (two) times daily as needed for heartburn.  12/24/13  Yes Advani, Vernon Prey, MD  traMADol (ULTRAM) 50 MG tablet Take 1 tablet (50 mg total) by mouth every 8 (eight) hours as needed. Patient not taking: Reported on 07/07/2017 12/24/13   Lorayne Marek, MD    Medications: . aspirin EC  81 mg Oral Daily  . mouth rinse  15  mL Mouth Rinse BID   . sodium chloride 125 mL/hr at 07/09/17 0410  . piperacillin-tazobactam (ZOSYN)  IV 3.375 g (07/09/17 0147)   Anti-infectives    Start     Dose/Rate Route Frequency Ordered Stop   07/08/17 0200  piperacillin-tazobactam (ZOSYN) IVPB 3.375 g     3.375 g 12.5 mL/hr over 240 Minutes Intravenous Every 8 hours 07/08/17 0120          Assessment/Plan Gallstone pancreatitis - lipase 5890 =>> 90 Elevated LFT - Improving bilirubin 4.4 =>> 1.5 E coli UTI  - >100K CAD COPD/emphysema - class 2 dyspnea/Hx of caustic lung injury - ongoing tobacco use Hx of GERD/dysphagia - EGD 8/11 FEN: IV fluids/NPO ID:  Ceftriaxone 1gm 07/07/17;  Zosyn 07/07/17 =>> day 3  DVT:  SCD only      PLan:  Will review with DR. Connor.  Dr. Therisa Doyne discussed MRCP, but I don't see it ordered and his LFT's are improving.  He is NPO.  I will put him back on H2.      LOS: 1 day    Kenyah Luba 07/09/2017 [phone]

## 2017-07-09 NOTE — Progress Notes (Signed)
PROGRESS NOTE    RUHAAN NORDAHL  ZTI:[id number] DOB: October 16, 1961 DOA: 07/07/2017 PCP: Patient, [name]  Outpatient Specialists:     Brief Narrative:  56 ? UA + in-stent re-stenosis PDA stent + PCI 06/26/13  prior CAD  DES-->PDA 2.5 x 16 mm Rh Fever COPD + emphysema, class 2 dyspnea   PFTs 10/21/2009 (FEV1/FVC 60%; FEV1 3.09/83%, FVC 5.11/101%, Small airway 57%, DLCO 21.23/63% Caustic lung injury [ammonia gas]  Quit smoking 04/2010  functional status-can walk 2 blocks Hiatal hernia    a.  EGD 08/03/2010:  mild stricture; mild Grade 2 esophagitis; s/p dilatation; stomach normal; needs h. pylori test if symptoms persist  Colonic polyps, hx of    a.  colo 08/03/2010:  1.5 cm polyp removed; path    b.  colo in 07/2011 Chr LBP On disability  Admitted with severe abd pain after leaving AMA from Ed 7/21 Has GB stone pancreatitis vs CBD stone GI and gen surg consulted     Assessment & Plan:   Principal Problem:   Gallstone pancreatitis Active Problems:   TOBACCO ABUSE   COPD (chronic obstructive pulmonary disease) (HCC)   Coronary atherosclerosis of native coronary artery   Acute cholecystitis   Abdominal pain   Abdominal pain 2/2 gallstone pancreatitis with cholecystitis-BISAP score <2 Transaminitis: Acute. AST 292, ALT 685, AP 180, and total bilirubin 4.4.---now 1.6. ?CBD stone passage  lipase  5,820--->870--->90 Pain control IV morphine 2-4 q 3 prn saline 125 cc/h Rest as per gen surg and GI Cardiology consult for perioperative clearance--he is an active guy at home and mainly limited by low back pain   Leukocytosis-probably early cholangitis vs cholecystitis  Acute. WBC elevated at 12.8.   - Empiric antibiotics of Zosyn started  Urinary tract infection:  Not clean catch Disregard UA  Coronary artery disease status post cardiac stent   ASa held on admit-resumed  COPD without acute exacerbation Ammoniacal pneumoconiosis Continued smoker Grade 2  dyspnea Tobacco abuse: Patient reports smoking 1-2 packs of cigarettes per day on average. He declines nicotine patch offered. - Counseled on the need for cessation of tobacco use.  Chr LBP  Holding meds, can give IV morphine   Uncontrolled HTN    scd Full code No family inpatient   Consultants:   Gi  gen surg  Procedures:   None yet  Antimicrobials:   Zosyn     Subjective:  Pain mainly while moving around Feels warm to touch Nothing by mouth Understands the need for potential surgery  Objective: Vitals:   07/08/17 0630 07/08/17 1430 07/08/17 2301 07/09/17 0624  BP: 134/71 121/66 129/71 132/73  Pulse: 64 63 68 70  Resp: 18 18 18 18   Temp: 97.8 F (36.6 C) 97.7 F (36.5 C) 98.7 F (37.1 C) 98.8 F (37.1 C)  TempSrc:  Oral Oral   SpO2: 93% 92% 92% 92%  Weight:      Height:        Intake/Output Summary (Last 24 hours) at 07/09/17 1145 Last data filed at 07/09/17 0624  Gross per 24 hour  Intake             1275 ml  Output             2200 ml  Net             -925 ml   Filed Weights   07/07/17 2155 07/08/17 0222  Weight: 95.3 kg (210 lb) 92.9 kg (204 lb 12.9 oz)  Examination:  Looks about the same as yesterday although maybe a little bit more uncomfortable Warm to touch No icterus No pallor S1-S2 no murmur rub or gallop Tenderness epigastrium No rebound No guarding No lower extremity edema Neurological intact  Data Reviewed: I have personally reviewed following labs and imaging studies  CBC:  Recent Labs Lab 07/07/17 1509 07/07/17 2156 07/08/17 0534  WBC 6.4 12.8* 11.8*  HGB 14.7 15.5 15.3  HCT 44.4 46.3 44.2  MCV 91.7 91.1 90.8  PLT 178 178 258   Basic Metabolic Panel:  Recent Labs Lab 07/07/17 1509 07/07/17 2156 07/08/17 0534 07/09/17 0616  NA 135 137 139 138  K 4.3 3.9 4.6 3.9  CL 105 105 107 108  CO2 22 21* 24 23  GLUCOSE 95 121* 134* 99  BUN 11 10 11 11   CREATININE 0.90 1.02 0.91 0.92  CALCIUM 8.9 9.0  8.8* 8.4*   GFR: Estimated Creatinine Clearance: 98.4 mL/min (by C-G formula based on SCr of 0.92 mg/dL). Liver Function Tests:  Recent Labs Lab 07/07/17 1509 07/07/17 2156 07/08/17 0534 07/09/17 0616  AST 341* 292* 185* 58*  ALT 738* 685* 591* 319*  ALKPHOS 170* 180* 186* 141*  BILITOT 4.1* 4.4* 1.6* 1.5*  PROT 6.9 7.4 7.4 6.0*  ALBUMIN 3.8 4.0 3.8 3.3*    Recent Labs Lab 07/07/17 1509 07/07/17 2156 07/08/17 1034 07/09/17 0616  LIPASE 27 5,890* 870* 90*   No results for input(s): AMMONIA in the last 168 hours. Coagulation Profile:  Recent Labs Lab 07/08/17 1034  INR 0.97   Cardiac Enzymes: No results for input(s): CKTOTAL, CKMB, CKMBINDEX, TROPONINI in the last 168 hours. BNP (last 3 results) No results for input(s): PROBNP in the last 8760 hours. HbA1C: No results for input(s): HGBA1C in the last 72 hours. CBG: No results for input(s): GLUCAP in the last 168 hours. Lipid Profile: No results for input(s): CHOL, HDL, LDLCALC, TRIG, CHOLHDL, LDLDIRECT in the last 72 hours. Thyroid Function Tests: No results for input(s): TSH, T4TOTAL, FREET4, T3FREE, THYROIDAB in the last 72 hours. Anemia Panel: No results for input(s): VITAMINB12, FOLATE, FERRITIN, TIBC, IRON, RETICCTPCT in the last 72 hours. Urine analysis:    Component Value Date/Time   COLORURINE AMBER (A) 07/07/2017 1550   APPEARANCEUR CLEAR 07/07/2017 1550   LABSPEC 1.005 07/07/2017 1550   PHURINE 7.0 07/07/2017 1550   GLUCOSEU NEGATIVE 07/07/2017 1550   HGBUR NEGATIVE 07/07/2017 1550   BILIRUBINUR NEGATIVE 07/07/2017 1550   KETONESUR NEGATIVE 07/07/2017 1550   PROTEINUR NEGATIVE 07/07/2017 1550   NITRITE POSITIVE (A) 07/07/2017 1550   LEUKOCYTESUR SMALL (A) 07/07/2017 1550       Radiology Studies: Dg Chest 2 View  Result Date: 07/07/2017 CLINICAL DATA:  Epigastric pain with cough EXAM: CHEST  2 VIEW COMPARISON:  10/04/2013 FINDINGS: Hyperinflation with emphysematous disease. Linear  scarring or atelectasis at both lung bases. No focal consolidation or pleural effusion. Normal heart size. No pneumothorax. Mild wedging of midthoracic vertebra. IMPRESSION: 1. Hyperinflation with emphysematous disease. 2. Linear scarring or atelectasis within both lung bases. 3. No focal pulmonary infiltrate is seen. Electronically Signed   By: Donavan Foil M.D.   On: 07/07/2017 16:41   Ct Abdomen Pelvis W Contrast  Result Date: 07/07/2017 CLINICAL DATA:  Epigastric pain. EXAM: CT ABDOMEN AND PELVIS WITH CONTRAST TECHNIQUE: Multidetector CT imaging of the abdomen and pelvis was performed using the standard protocol following bolus administration of intravenous contrast. CONTRAST:  18mL ISOVUE-300 IOPAMIDOL (ISOVUE-300) INJECTION 61% COMPARISON:  None. FINDINGS:  Lower chest: Emphysema noted in both lower lungs with areas of architectural distortion and scarring. Hepatobiliary: No focal abnormality within the liver parenchyma. 2 cm noncalcified stone is identified in the gallbladder fundus. There is also a 7-8 mm calcified gallstone. Subtle pericholecystic fluid and/or gallbladder wall thickening is evident. Common bile duct measures 10 mm diameter in the head of pancreas. Pancreas: No focal mass lesion. No dilatation of the main duct. No intraparenchymal cyst. No peripancreatic edema. Spleen: No splenomegaly. No focal mass lesion. Adrenals/Urinary Tract: No adrenal nodule or mass. 8 mm hypoattenuating subcapsular lesion in the interpolar left kidney is too small to characterize but likely a cyst. Right kidney unremarkable. No evidence for hydroureter. The urinary bladder appears normal for the degree of distention. Stomach/Bowel: Stomach is nondistended. No gastric wall thickening. No evidence of outlet obstruction. Duodenum is normally positioned as is the ligament of Treitz. No small bowel wall thickening. No small bowel dilatation. The terminal ileum is normal. The appendix is normal. Diverticular changes  are noted in the left colon without evidence of diverticulitis. Vascular/Lymphatic: There is abdominal aortic atherosclerosis without aneurysm. There is no gastrohepatic or hepatoduodenal ligament lymphadenopathy. No intraperitoneal or retroperitoneal lymphadenopathy. No pelvic sidewall lymphadenopathy. Reproductive: The prostate gland and seminal vesicles have normal imaging features. Other: No intraperitoneal free fluid. Musculoskeletal: A left-sided Spigelian hernia is identified, containing only fat and dissecting into the space between the internal and external oblique muscles with hernia sac measuring 2.4 x 6.7 x 5.9 cm. Bone windows reveal no worrisome lytic or sclerotic osseous lesions. IMPRESSION: 1. Gallstones with CT findings suggesting pericholecystic fluid and/or gallbladder wall thickening. Mild intra and extrahepatic biliary duct dilatation is associated. Acute cholecystitis is a concern by CT. 2. Left-sided Spigelian abdominal wall hernia containing only fat which dissects into the space between the internal and external oblique muscles. Electronically Signed   By: Misty Stanley M.D.   On: 07/07/2017 17:59        Scheduled Meds: . aspirin EC  81 mg Oral Daily  . mouth rinse  15 mL Mouth Rinse BID   Continuous Infusions: . sodium chloride 125 mL/hr at 07/09/17 0410  . famotidine (PEPCID) IV Stopped (07/09/17 0908)  . piperacillin-tazobactam (ZOSYN)  IV 3.375 g (07/09/17 1058)     LOS: 1 day    Time spent: [place], MD [facility]   If 7PM-7AM, please contact night-coverage [email or web address] Password [email or web address] 07/09/2017, 11:45 AM

## 2017-07-09 NOTE — Progress Notes (Signed)
[facility] Gastroenterology Progress Note  John Forbes 56 y.o. [birth year]  CC:  Acute pancreatitis   Subjective: Patient continues to have epigastric abdominal pain but is improved compared to admission. He was not able to get MRI done because of the claustrophobia. Surgery notes reviewed. Plan for possible laparoscopic cholecystectomy tomorrow.  ROS : Negative for active chest pain and shortness of breath.   Objective: Vital signs in last 24 hours: Vitals:   07/08/17 2301 07/09/17 0624  BP: 129/71 132/73  Pulse: 68 70  Resp: 18 18  Temp: 98.7 F (37.1 C) 98.8 F (37.1 C)    Physical Exam:  General:  Alert, cooperative, no distress, appears stated age  Head:  Normocephalic, without obvious abnormality, atraumatic  Eyes:  , EOM's intact,   Lungs:   Clear to auscultation bilaterally, respirations unlabored  Heart:  Regular rate and rhythm, S1, S2 normal  Abdomen:   Soft,Epigastric tenderness to palpation, bowel sounds active all four quadrants,  no masses, no peritoneal signs   Extremities: Extremities normal, atraumatic, no  edema  Pulses: 2+ and symmetric    Lab Results:  Recent Labs  07/08/17 0534 07/09/17 0616  NA 139 138  K 4.6 3.9  CL 107 108  CO2 24 23  GLUCOSE 134* 99  BUN 11 11  CREATININE 0.91 0.92  CALCIUM 8.8* 8.4*    Recent Labs  07/08/17 0534 07/09/17 0616  AST 185* 58*  ALT 591* 319*  ALKPHOS 186* 141*  BILITOT 1.6* 1.5*  PROT 7.4 6.0*  ALBUMIN 3.8 3.3*    Recent Labs  07/07/17 2156 07/08/17 0534  WBC 12.8* 11.8*  HGB 15.5 15.3  HCT 46.3 44.2  MCV 91.1 90.8  PLT 178 169    Recent Labs  07/08/17 1034  LABPROT 12.9  INR 0.97      Assessment/Plan: - Acute pancreatitis. Most likely from gallstone. Not able to get MRCP done. - Elevated LFTs. Could be from passed  CBD stone. - Due for surveillance colonoscopy  Recommendations ----------------------- - Patient  is tentatively scheduled for laparoscopic cholecystectomy  tomorrow. Recommend IOC for evaluation of possible common bile duct stones. ERCP only if positive IOC. - Recommend outpatient colonoscopy once acute issues are resolved. -  GI will follow.   Otis Brace MD, [place] 07/09/2017, 11:20 AM  Pager [phone]  If no answer or after 5 PM call [phone]

## 2017-07-09 NOTE — Progress Notes (Signed)
  Echocardiogram 2D Echocardiogram has been performed.  John Forbes 07/09/2017, 1:05 PM

## 2017-07-10 ENCOUNTER — Ambulatory Visit: Payer: Self-pay | Admitting: Surgery

## 2017-07-10 ENCOUNTER — Inpatient Hospital Stay (HOSPITAL_COMMUNITY): Payer: Medicare Other

## 2017-07-10 ENCOUNTER — Telehealth: Payer: Self-pay | Admitting: Emergency Medicine

## 2017-07-10 DIAGNOSIS — Z01810 Encounter for preprocedural cardiovascular examination: Secondary | ICD-10-CM

## 2017-07-10 DIAGNOSIS — F17200 Nicotine dependence, unspecified, uncomplicated: Secondary | ICD-10-CM

## 2017-07-10 DIAGNOSIS — R079 Chest pain, unspecified: Secondary | ICD-10-CM

## 2017-07-10 DIAGNOSIS — R9439 Abnormal result of other cardiovascular function study: Secondary | ICD-10-CM

## 2017-07-10 LAB — NM MYOCAR MULTI W/SPECT W/WALL MOTION / EF
CHL CUP RESTING HR STRESS: 58 {beats}/min
CSEPED: 0 min
CSEPPHR: 97 {beats}/min
Estimated workload: 1 METS
Exercise duration (sec): 0 s
MPHR: 164 {beats}/min
Percent HR: 59 %

## 2017-07-10 LAB — CBC
HCT: 36 % — ABNORMAL LOW (ref 39.0–52.0)
HEMOGLOBIN: 12 g/dL — AB (ref 13.0–17.0)
MCH: 30.1 pg (ref 26.0–34.0)
MCHC: 33.3 g/dL (ref 30.0–36.0)
MCV: 90.2 fL (ref 78.0–100.0)
PLATELETS: 128 10*3/uL — AB (ref 150–400)
RBC: 3.99 MIL/uL — ABNORMAL LOW (ref 4.22–5.81)
RDW: 14.9 % (ref 11.5–15.5)
WBC: 8.3 10*3/uL (ref 4.0–10.5)

## 2017-07-10 LAB — SURGICAL PCR SCREEN
MRSA, PCR: NEGATIVE
STAPHYLOCOCCUS AUREUS: NEGATIVE

## 2017-07-10 LAB — COMPREHENSIVE METABOLIC PANEL
ALT: 196 U/L — AB (ref 17–63)
ANION GAP: 7 (ref 5–15)
AST: 30 U/L (ref 15–41)
Albumin: 2.9 g/dL — ABNORMAL LOW (ref 3.5–5.0)
Alkaline Phosphatase: 106 U/L (ref 38–126)
BUN: 8 mg/dL (ref 6–20)
CHLORIDE: 112 mmol/L — AB (ref 101–111)
CO2: 23 mmol/L (ref 22–32)
Calcium: 8.3 mg/dL — ABNORMAL LOW (ref 8.9–10.3)
Creatinine, Ser: 0.86 mg/dL (ref 0.61–1.24)
Glucose, Bld: 83 mg/dL (ref 65–99)
POTASSIUM: 3.8 mmol/L (ref 3.5–5.1)
SODIUM: 142 mmol/L (ref 135–145)
Total Bilirubin: 1.4 mg/dL — ABNORMAL HIGH (ref 0.3–1.2)
Total Protein: 5.8 g/dL — ABNORMAL LOW (ref 6.5–8.1)

## 2017-07-10 LAB — MAGNESIUM: MAGNESIUM: 1.8 mg/dL (ref 1.7–2.4)

## 2017-07-10 LAB — LIPASE, BLOOD: LIPASE: 36 U/L (ref 11–51)

## 2017-07-10 MED ORDER — REGADENOSON 0.4 MG/5ML IV SOLN
INTRAVENOUS | Status: AC
  Filled 2017-07-10: qty 5

## 2017-07-10 MED ORDER — TECHNETIUM TC 99M TETROFOSMIN IV KIT
10.0000 | PACK | Freq: Once | INTRAVENOUS | Status: AC | PRN
  Administered 2017-07-10: 10 via INTRAVENOUS

## 2017-07-10 MED ORDER — METOPROLOL TARTRATE 25 MG PO TABS
25.0000 mg | ORAL_TABLET | Freq: Two times a day (BID) | ORAL | Status: DC
  Administered 2017-07-10 – 2017-07-13 (×5): 25 mg via ORAL
  Filled 2017-07-10 (×6): qty 1

## 2017-07-10 MED ORDER — REGADENOSON 0.4 MG/5ML IV SOLN
0.4000 mg | Freq: Once | INTRAVENOUS | Status: DC
  Filled 2017-07-10: qty 5

## 2017-07-10 MED ORDER — TECHNETIUM TC 99M TETROFOSMIN IV KIT
30.0000 | PACK | Freq: Once | INTRAVENOUS | Status: AC | PRN
  Administered 2017-07-10: 30 via INTRAVENOUS

## 2017-07-10 MED ORDER — ATORVASTATIN CALCIUM 40 MG PO TABS: 40.0000 mg | ORAL_TABLET | Freq: Every day | ORAL | Status: DC

## 2017-07-10 NOTE — Progress Notes (Signed)
CC:  Abdominal pain  Subjective: Had his stress test this morning- thinks it went well. Epigastric pain continues to improve.   Objective: Vital signs in last 24 hours: Temp:  [97.9 F (36.6 C)-98.9 F (37.2 C)] 98 F (36.7 C) (07/24 0454) Pulse Rate:  [58-96] 85 (07/24 0907) Resp:  [16-18] 16 (07/24 0454) BP: (115-131)/(67-72) 125/68 (07/24 0907) SpO2:  [94 %-95 %] 95 % (07/24 0454) Last BM Date: 07/09/17  Component 2d ago - 07/07/17  Specimen Description URINE, RANDOM   Special Requests NONE   Culture >=100,000 COLONIES/mL ESCHERICHIA COLI    Report Status PENDING   Resulting Agency [facility]     Intake/Output from previous day: 07/23 0701 - 07/24 0700 In: 2939.6 [I.V.:2689.6; IV Piggyback:250] Out: 1125 [Urine:1125] Intake/Output this shift: No intake/output data recorded.  Alert and cooperative Unlabored respirations, clear bilaterally Abdomen soft, nondistended, mildly tender epigastrium but better  Lab Results:   Recent Labs  07/08/17 0534 07/10/17 0607  WBC 11.8* 8.3  HGB 15.3 12.0*  HCT 44.2 36.0*  PLT 169 128*    BMET  Recent Labs  07/09/17 0616 07/10/17 0607  NA 138 142  K 3.9 3.8  CL 108 112*  CO2 23 23  GLUCOSE 99 83  BUN 11 8  CREATININE 0.92 0.86  CALCIUM 8.4* 8.3*   PT/INR  Recent Labs  07/08/17 1034  LABPROT 12.9  INR 0.97     Recent Labs Lab 07/07/17 1509 07/07/17 2156 07/08/17 0534 07/09/17 0616 07/10/17 0607  AST 341* 292* 185* 58* 30  ALT 738* 685* 591* 319* 196*  ALKPHOS 170* 180* 186* 141* 106  BILITOT 4.1* 4.4* 1.6* 1.5* 1.4*  PROT 6.9 7.4 7.4 6.0* 5.8*  ALBUMIN 3.8 4.0 3.8 3.3* 2.9*     Lipase     Component Value Date/Time   LIPASE 36 07/10/2017 0607     Prior to Admission medications   Medication Sig Start Date End Date Taking? Authorizing Provider  albuterol (PROVENTIL) (2.5 MG/3ML) 0.083% nebulizer solution Take 3 mLs (2.5 mg total) by nebulization every 6 (six) hours as needed for wheezing.  09/08/13  Yes Theodis Blaze, MD  aspirin EC 81 MG EC tablet Take 1 tablet (81 mg total) by mouth daily. 06/27/13  Yes Turner, Eber Hong, MD  cyclobenzaprine (FLEXERIL) 10 MG tablet Take 1 tablet (10 mg total) by mouth 3 (three) times daily as needed for muscle spasms. 12/24/13  Yes Advani, Vernon Prey, MD  ibuprofen (ADVIL,MOTRIN) 200 MG tablet Take 200-400 mg by mouth every 6 (six) hours as needed (for headaches).   Yes [provider]  nitroGLYCERIN (NITROSTAT) 0.4 MG SL tablet Place 1 tablet (0.4 mg total) under the tongue every 5 (five) minutes as needed for chest pain. 06/27/13  Yes Turner, Eber Hong, MD  ranitidine (ZANTAC) 150 MG tablet Take 1 tablet (150 mg total) by mouth 2 (two) times daily. Patient taking differently: Take 150 mg by mouth 2 (two) times daily as needed for heartburn.  12/24/13  Yes Advani, Vernon Prey, MD  traMADol (ULTRAM) 50 MG tablet Take 1 tablet (50 mg total) by mouth every 8 (eight) hours as needed. Patient not taking: Reported on 07/07/2017 12/24/13   Lorayne Marek, MD    Medications: . aspirin EC  81 mg Oral Daily  . atorvastatin  40 mg Oral q1800  . chlorhexidine  60 mL Topical Once  . isosorbide mononitrate  30 mg Oral Daily  . mouth rinse  15 mL Mouth Rinse BID  .  metoprolol tartrate  12.5 mg Oral BID  . regadenoson      . regadenoson  0.4 mg Intravenous Once   . sodium chloride 125 mL/hr at 07/10/17 0746  . famotidine (PEPCID) IV Stopped (07/10/17 1036)  . piperacillin-tazobactam (ZOSYN)  IV 3.375 g (07/10/17 1100)   Anti-infectives    Start     Dose/Rate Route Frequency Ordered Stop   07/08/17 0200  piperacillin-tazobactam (ZOSYN) IVPB 3.375 g     3.375 g 12.5 mL/hr over 240 Minutes Intravenous Every 8 hours 07/08/17 0120          Assessment/Plan Gallstone pancreatitis - lipase 5890 =>> 36 Elevated LFT - Improving bilirubin 4.4 =>> 1.4 E coli UTI  - >100K CAD COPD/emphysema - class 2 dyspnea/Hx of caustic lung injury - ongoing tobacco use Hx of  GERD/dysphagia - EGD 8/11 FEN: IV fluids/NPO ID:  Ceftriaxone 1gm 07/07/17;  Zosyn 07/07/17 =>> day 4  DVT:  SCD only    PLan:  Tentatively posted for lap chole tomorrow 7/25. Will follow up final report of stress test. NPO midnight. We discussed the surgery, risks of bleeding, infection, pain, scarring, intraabdominal injury specifically to the common bile duct and sequelae, conversion to open surgery; blood clots, pneumonia, stroke heart attack and death. He expressed understanding and desires to proceed.       LOS: 2 days    John Forbes 07/10/2017 [phone]

## 2017-07-10 NOTE — Telephone Encounter (Signed)
Post ED Visit - Positive Culture Follow-up  Culture report reviewed by antimicrobial stewardship pharmacist:  []  Elenor Quinones, Pharm.D. []  Heide Guile, Pharm.D., BCPS AQ-ID []  Parks Neptune, Pharm.D., BCPS []  Alycia Rossetti, Pharm.D., BCPS []  [place], Pharm.D., BCPS, AAHIVP []  Legrand Como, Pharm.D., BCPS, AAHIVP []  Salome Arnt, PharmD, BCPS []  Dimitri Ped, PharmD, BCPS []  Vincenza Hews, PharmD, BCPS Jimmy Footman PharmD  Positive urine culture Is an inpatient , being treated with Zosyn. no further patient follow-up is required at this time.  Hazle Nordmann 07/10/2017, 10:27 AM

## 2017-07-10 NOTE — Progress Notes (Signed)
PROGRESS NOTE    John Forbes  ZOX:[id number] DOB: October 07, 1961 DOA: 07/07/2017 PCP: Patient, [name]  Outpatient Specialists:     Brief Narrative:  78 ? UA + in-stent re-stenosis PDA stent + PCI 06/26/13  prior CAD  DES-->PDA 2.5 x 16 mm Rh Fever COPD + emphysema, class 2 dyspnea   PFTs 10/21/2009 (FEV1/FVC 60%; FEV1 3.09/83%, FVC 5.11/101%, Small airway 57%, DLCO 21.23/63% Caustic lung injury [ammonia gas]  Quit smoking 04/2010  functional status-can walk 2 blocks Hiatal hernia    a.  EGD 08/03/2010:  mild stricture; mild Grade 2 esophagitis; s/p dilatation; stomach normal; needs h. pylori test if symptoms persist  Colonic polyps, hx of    a.  colo 08/03/2010:  1.5 cm polyp removed; path    b.  colo in 07/2011 Chr LBP On disability  Admitted with severe abd pain after leaving AMA from Ed 7/21 Has GB stone pancreatitis vs CBD stone GI and gen surg consulted     Assessment & Plan:   Principal Problem:   Gallstone pancreatitis Active Problems:   TOBACCO ABUSE   COPD (chronic obstructive pulmonary disease) ([place])   Coronary artery disease involving native coronary artery of native heart with unstable angina pectoris (HCC)   Acute cholecystitis   Abdominal pain   LFTs abnormal   Chest pain with moderate risk for cardiac etiology   Abdominal pain 2/2 gallstone pancreatitis with cholecystitis-BISAP score <2 Transaminitis: Acute. AST 292, ALT 685, AP 180, and total bilirubin 4.4 non admission.---now 1.6. ?CBD stone passage  lipase  5,820--->870--->90-->36 Pain control IV morphine 2-4 q 3 prn saline 125 cc/h Rest as per gen surg and GI myoview stress testing today--defer to cards clearance   Leukocytosis-probably early cholangitis vs cholecystitis  Acute. WBC elevated at 12.8.  --->8.2 - Empiric antibiotics of Zosyn started  Urinary tract infection:  Not clean catch Disregard UA  Coronary artery disease status post cardiac stent   decompensated diastolic heart  failure based on echo 7/23  ASa held on admit-resumed   restarted on Imdur 30 lopressor 12.5 bid by cardiology on 7/23  COPD without acute exacerbation Ammoniacal pneumoconiosis Continued smoker Grade 2 dyspnea Tobacco abuse: Patient reports smoking 1-2 packs of cigarettes per day on average. He declines nicotine patch offered. - Counseled on the need for cessation of tobacco use  Chr LBP  Holding meds, can give IV morphine  scd Full code No family inpatient   Consultants:   Gi  gen surg  Cardiology    Procedures:  Echo - Compared to a prior study in 2011, the LVEF is unchanged. Diastolic hf  Antimicrobials:   Zosyn     Subjective:  eatign clears pain much better Admits now to some chest pains On exertion doenst want nicotine replacement Understands needs for surgery  Objective: Vitals:   07/10/17 0856 07/10/17 0903 07/10/17 0905 07/10/17 0907  BP: 121/67 121/69 131/72 125/68  Pulse: (!) 58 96 85 85  Resp:      Temp:      TempSrc:      SpO2:      Weight:      Height:        Intake/Output Summary (Last 24 hours) at 07/10/17 1217 Last data filed at 07/10/17 0455  Gross per 24 hour  Intake          2214.58 ml  Output              775 ml  Net  1439.58 ml   Filed Weights   07/07/17 2155 07/08/17 0222  Weight: 95.3 kg (210 lb) 92.9 kg (204 lb 12.9 oz)    Examination:  eomi ncat No ict abd soft, nt nd no rebound cta b s1 s 2no m Neuro gorssly moves 4 limbns  Data Reviewed: I have personally reviewed following labs and imaging studies  CBC:  Recent Labs Lab 07/07/17 1509 07/07/17 2156 07/08/17 0534 07/10/17 0607  WBC 6.4 12.8* 11.8* 8.3  HGB 14.7 15.5 15.3 12.0*  HCT 44.4 46.3 44.2 36.0*  MCV 91.7 91.1 90.8 90.2  PLT 178 178 169 841*   Basic Metabolic Panel:  Recent Labs Lab 07/07/17 1509 07/07/17 2156 07/08/17 0534 07/09/17 0616 07/10/17 0607  NA 135 137 139 138 142  K 4.3 3.9 4.6 3.9 3.8  CL 105 105 107  108 112*  CO2 22 21* 24 23 23   GLUCOSE 95 121* 134* 99 83  BUN 11 10 11 11 8   CREATININE 0.90 1.02 0.91 0.92 0.86  CALCIUM 8.9 9.0 8.8* 8.4* 8.3*  MG  --   --   --   --  1.8   GFR: Estimated Creatinine Clearance: 105.3 mL/min (by C-G formula based on SCr of 0.86 mg/dL). Liver Function Tests:  Recent Labs Lab 07/07/17 1509 07/07/17 2156 07/08/17 0534 07/09/17 0616 07/10/17 0607  AST 341* 292* 185* 58* 30  ALT 738* 685* 591* 319* 196*  ALKPHOS 170* 180* 186* 141* 106  BILITOT 4.1* 4.4* 1.6* 1.5* 1.4*  PROT 6.9 7.4 7.4 6.0* 5.8*  ALBUMIN 3.8 4.0 3.8 3.3* 2.9*    Recent Labs Lab 07/07/17 1509 07/07/17 2156 07/08/17 1034 07/09/17 0616 07/10/17 0607  LIPASE 27 5,890* 870* 90* 36   No results for input(s): AMMONIA in the last 168 hours. Coagulation Profile:  Recent Labs Lab 07/08/17 1034  INR 0.97   Cardiac Enzymes: No results for input(s): CKTOTAL, CKMB, CKMBINDEX, TROPONINI in the last 168 hours. BNP (last 3 results) No results for input(s): PROBNP in the last 8760 hours. HbA1C: No results for input(s): HGBA1C in the last 72 hours. CBG: No results for input(s): GLUCAP in the last 168 hours. Lipid Profile:  Recent Labs  07/09/17 0616  CHOL 167  HDL 34*  LDLCALC 110*  TRIG 115  CHOLHDL 4.9   Thyroid Function Tests: No results for input(s): TSH, T4TOTAL, FREET4, T3FREE, THYROIDAB in the last 72 hours. Anemia Panel: No results for input(s): VITAMINB12, FOLATE, FERRITIN, TIBC, IRON, RETICCTPCT in the last 72 hours. Urine analysis:    Component Value Date/Time   COLORURINE AMBER (A) 07/07/2017 1550   APPEARANCEUR CLEAR 07/07/2017 1550   LABSPEC 1.005 07/07/2017 1550   PHURINE 7.0 07/07/2017 1550   GLUCOSEU NEGATIVE 07/07/2017 1550   HGBUR NEGATIVE 07/07/2017 1550   BILIRUBINUR NEGATIVE 07/07/2017 1550   KETONESUR NEGATIVE 07/07/2017 1550   PROTEINUR NEGATIVE 07/07/2017 1550   NITRITE POSITIVE (A) 07/07/2017 1550   LEUKOCYTESUR SMALL (A) 07/07/2017  1550       Radiology Studies: No results found.      Scheduled Meds: . aspirin EC  81 mg Oral Daily  . atorvastatin  40 mg Oral q1800  . chlorhexidine  60 mL Topical Once  . isosorbide mononitrate  30 mg Oral Daily  . mouth rinse  15 mL Mouth Rinse BID  . metoprolol tartrate  12.5 mg Oral BID  . regadenoson      . regadenoson  0.4 mg Intravenous Once   Continuous Infusions: . sodium  chloride 100 mL/hr at 07/10/17 1217  . famotidine (PEPCID) IV Stopped (07/10/17 1036)  . piperacillin-tazobactam (ZOSYN)  IV 3.375 g (07/10/17 1100)     LOS: 2 days    Time spent: [place], MD [facility]   If 7PM-7AM, please contact night-coverage [email or web address] Password [facility] 07/10/2017, 12:17 PM

## 2017-07-10 NOTE — Progress Notes (Addendum)
Progress Note  Patient Name: John Forbes Date of Encounter: 07/10/2017  Primary Cardiologist: T. Turner, MD   Subjective   No chest pain or sob.  RUQ discomfort improving.  For MV today.  Inpatient Medications    Scheduled Meds: . regadenoson      . aspirin EC  81 mg Oral Daily  . chlorhexidine  60 mL Topical Once  . isosorbide mononitrate  30 mg Oral Daily  . mouth rinse  15 mL Mouth Rinse BID  . metoprolol tartrate  12.5 mg Oral BID  . regadenoson  0.4 mg Intravenous Once   Continuous Infusions: . sodium chloride 125 mL/hr at 07/10/17 0746  . famotidine (PEPCID) IV Stopped (07/09/17 2213)  . piperacillin-tazobactam (ZOSYN)  IV Stopped (07/10/17 4235)   PRN Meds: ipratropium-albuterol, morphine injection, ondansetron **OR** ondansetron (ZOFRAN) IV   Vital Signs    Vitals:   07/09/17 1446 07/09/17 2119 07/10/17 0454 07/10/17 0856  BP: 120/69 115/68 118/69 121/67  Pulse: 69 63 61 (!) 58  Resp: 18 18 16    Temp: 97.9 F (36.6 C) 98.9 F (37.2 C) 98 F (36.7 C)   TempSrc: Oral Oral Oral   SpO2: 94% 95% 95%   Weight:      Height:        Intake/Output Summary (Last 24 hours) at 07/10/17 0903 Last data filed at 07/10/17 0455  Gross per 24 hour  Intake          2639.58 ml  Output             1125 ml  Net          1514.58 ml   Filed Weights   07/07/17 2155 07/08/17 0222  Weight: 210 lb (95.3 kg) 204 lb 12.9 oz (92.9 kg)    Physical Exam   GEN: Well nourished, well developed, in no acute distress.  HEENT: Grossly normal.  Neck: Supple, no JVD, carotid bruits, or masses. Cardiac: RRR, no murmurs, rubs, or gallops. No clubbing, cyanosis, edema.  Radials/DP/PT 2+ and equal bilaterally.  Respiratory:  Respirations regular and unlabored, clear to auscultation bilaterally. GI: Soft, mild RUQ tenderness to light palpation, nondistended, BS + x 4. MS: no deformity or atrophy. Skin: warm and dry, no rash. Neuro:  Strength and sensation are intact. Psych:  AAOx3.  Normal affect.  Labs    Chemistry Recent Labs Lab 07/08/17 0534 07/09/17 0616 07/10/17 0607  NA 139 138 142  K 4.6 3.9 3.8  CL 107 108 112*  CO2 24 23 23   GLUCOSE 134* 99 83  BUN 11 11 8   CREATININE 0.91 0.92 0.86  CALCIUM 8.8* 8.4* 8.3*  PROT 7.4 6.0* 5.8*  ALBUMIN 3.8 3.3* 2.9*  AST 185* 58* 30  ALT 591* 319* 196*  ALKPHOS 186* 141* 106  BILITOT 1.6* 1.5* 1.4*  GFRNONAA >60 >60 >60  GFRAA >60 >60 >60  ANIONGAP 8 7 7      Hematology Recent Labs Lab 07/07/17 2156 07/08/17 0534 07/10/17 0607  WBC 12.8* 11.8* 8.3  RBC 5.08 4.87 3.99*  HGB 15.5 15.3 12.0*  HCT 46.3 44.2 36.0*  MCV 91.1 90.8 90.2  MCH 30.5 31.4 30.1  MCHC 33.5 34.6 33.3  RDW 15.4 15.3 14.9  PLT 178 169 128*     Radiology    No results found.  Telemetry    Seen in nuc med - in sinus rhythm - Personally Reviewed  Cardiac Studies   2D Echocardiogram 7.23.2018  Study Conclusions   -  Left ventricle: The cavity size was normal. Wall thickness was   increased in a pattern of mild LVH. Systolic function was normal.   The estimated ejection fraction was in the range of 55% to 60%.   Wall motion was normal; there were no regional wall motion   abnormalities. Doppler parameters are consistent with abnormal   left ventricular relaxation (grade 1 diastolic dysfunction). The   E/e&' ratio is between 8-15, suggesting indeterminate LV filling   pressure. - Mitral valve: Mildly thickened leaflets . There was trivial   regurgitation. - Left atrium: The atrium was normal in size. - Right atrium: The atrium was mildly dilated. - Inferior vena cava: The vessel was normal in size. The   respirophasic diameter changes were in the normal range (>= 50%),   consistent with normal central venous pressure.   Impressions:   - Compared to a prior study in 2011, the LVEF is unchanged.   Patient Profile     56 y.o. male with a h/o CAD, HL, tob abuse, and GERD who was admitted 7/21 2/2 gallstone  pancreatitis and cholecystitis.  Assessment & Plan    1.  Gallstone pancreatitis/cholecystitis:  Pending MRCP/possible ERCP followed by cholecystectomy.  With h/o exertional c/p and CAD he will undergo lexiscan MV today to better understand surgical risk.  2. CAD:  No c/p overnight.  For MV today.  Echo showed nl EF w/o significant valvular dzs.  Provided that MV is low risk, he should be able to go to OR w/o further eval.  Cont asa,  blocker, nitrate.  He should be on statin as well.  3.  Tob Abuse:  Cessation advised.  4.  HL:  LDL 110.  Add statin given CAD hx.  Signed, Murray Hodgkins, NP  07/10/2017, 9:03 AM     Patient seen and examined. Agree with assessment and plan. No chest pain. Had nuclear study today. Formal report is pending. By my review, there is scar with moderate peri-infarct inferior ischemia as well as ischemia in the lateral wall suggestive of progressive RCA and LCX disease/ischemia. EF 53%.  I have recommended definitive pre-operative cardiac catheterization. I discussed these findings with patient.  The risks and benefits of a cardiac catheterization including, but not limited to, death, stroke, MI, kidney damage and bleeding were discussed with the patient who indicates understanding and agrees to proceed.  I have spoken with Modena Jansky, [place].  Will ultimately need statin, but will not start now with elevated LFTs.   Troy Sine, MD, [facility] 07/10/2017 3:49 PM

## 2017-07-11 ENCOUNTER — Encounter (HOSPITAL_COMMUNITY)
Admission: EM | Disposition: A | Discharge status: Home / Self Care | Payer: Self-pay | Source: Home / Self Care | Attending: Family Medicine

## 2017-07-11 DIAGNOSIS — J449 Chronic obstructive pulmonary disease, unspecified: Secondary | ICD-10-CM

## 2017-07-11 DIAGNOSIS — I2510 Atherosclerotic heart disease of native coronary artery without angina pectoris: Secondary | ICD-10-CM

## 2017-07-11 HISTORY — PX: LEFT HEART CATH AND CORONARY ANGIOGRAPHY: CATH118249

## 2017-07-11 LAB — HIV ANTIBODY (ROUTINE TESTING W REFLEX): HIV SCREEN 4TH GENERATION: NONREACTIVE

## 2017-07-11 SURGERY — LEFT HEART CATH AND CORONARY ANGIOGRAPHY
Anesthesia: LOCAL

## 2017-07-11 MED ORDER — ASPIRIN 325 MG PO TABS
ORAL_TABLET | ORAL | Status: AC
Start: 2017-07-11 — End: 2017-07-11
  Filled 2017-07-11: qty 5

## 2017-07-11 MED ORDER — SODIUM CHLORIDE 0.9 % IJ SOLN
INTRAMUSCULAR | Status: AC
  Filled 2017-07-11: qty 50

## 2017-07-11 MED ORDER — EPINEPHRINE PF 1 MG/ML IJ SOLN
INTRAMUSCULAR | Status: AC
  Filled 2017-07-11: qty 1

## 2017-07-11 MED ORDER — DEXTROSE 50 % IV SOLN
INTRAVENOUS | Status: AC
  Filled 2017-07-11: qty 50

## 2017-07-11 MED ORDER — DOPAMINE-DEXTROSE 3.2-5 MG/ML-% IV SOLN
INTRAVENOUS | Status: AC
  Filled 2017-07-11: qty 250

## 2017-07-11 MED ORDER — ACETAMINOPHEN 325 MG PO TABS: 650.0000 mg | ORAL_TABLET | ORAL | Status: DC | PRN

## 2017-07-11 MED ORDER — HEPARIN (PORCINE) IN NACL 2-0.9 UNIT/ML-% IJ SOLN
INTRAMUSCULAR | Status: AC | PRN
  Administered 2017-07-11: 1000 mL

## 2017-07-11 MED ORDER — MIDAZOLAM HCL 2 MG/2ML IJ SOLN
INTRAMUSCULAR | Status: DC | PRN
  Administered 2017-07-11: 2 mg via INTRAVENOUS

## 2017-07-11 MED ORDER — SODIUM CHLORIDE 0.9 % WEIGHT BASED INFUSION: 1.0000 mL/kg/h | INTRAVENOUS | Status: AC

## 2017-07-11 MED ORDER — SODIUM CHLORIDE 0.9% FLUSH
3.0000 mL | Freq: Two times a day (BID) | INTRAVENOUS | Status: DC
  Administered 2017-07-12: 3 mL via INTRAVENOUS

## 2017-07-11 MED ORDER — FAMOTIDINE IN NACL 20-0.9 MG/50ML-% IV SOLN
INTRAVENOUS | Status: AC
  Filled 2017-07-11: qty 50

## 2017-07-11 MED ORDER — SODIUM CHLORIDE 0.9 % WEIGHT BASED INFUSION: 1.0000 mL/kg/h | INTRAVENOUS | Status: DC

## 2017-07-11 MED ORDER — LIDOCAINE HCL (PF) 1 % IJ SOLN
INTRAMUSCULAR | Status: DC | PRN
  Administered 2017-07-11: 2 mL via INTRADERMAL

## 2017-07-11 MED ORDER — EPINEPHRINE PF 1 MG/10ML IJ SOSY
PREFILLED_SYRINGE | INTRAMUSCULAR | Status: AC
  Filled 2017-07-11: qty 10

## 2017-07-11 MED ORDER — LIDOCAINE HCL (CARDIAC) 20 MG/ML IV SOLN
INTRAVENOUS | Status: AC
  Filled 2017-07-11: qty 5

## 2017-07-11 MED ORDER — METHYLPREDNISOLONE SODIUM SUCC 125 MG IJ SOLR
INTRAMUSCULAR | Status: AC
  Filled 2017-07-11: qty 2

## 2017-07-11 MED ORDER — IOPAMIDOL (ISOVUE-370) INJECTION 76%
INTRAVENOUS | Status: DC | PRN
  Administered 2017-07-11: 70 mL via INTRA_ARTERIAL

## 2017-07-11 MED ORDER — MIDAZOLAM HCL 2 MG/2ML IJ SOLN
INTRAMUSCULAR | Status: AC
  Filled 2017-07-11: qty 2

## 2017-07-11 MED ORDER — SODIUM CHLORIDE 0.9% FLUSH
3.0000 mL | Freq: Two times a day (BID) | INTRAVENOUS | Status: DC
  Administered 2017-07-11: 3 mL via INTRAVENOUS

## 2017-07-11 MED ORDER — DIPHENHYDRAMINE HCL 50 MG/ML IJ SOLN
INTRAMUSCULAR | Status: AC
  Filled 2017-07-11: qty 5

## 2017-07-11 MED ORDER — NITROGLYCERIN IN D5W 200-5 MCG/ML-% IV SOLN
INTRAVENOUS | Status: AC
  Filled 2017-07-11: qty 250

## 2017-07-11 MED ORDER — LIDOCAINE HCL (PF) 1 % IJ SOLN
INTRAMUSCULAR | Status: AC
Start: 2017-07-11 — End: 2017-07-11
  Filled 2017-07-11: qty 30

## 2017-07-11 MED ORDER — SODIUM CHLORIDE 0.9 % WEIGHT BASED INFUSION: 3.0000 mL/kg/h | INTRAVENOUS | Status: DC

## 2017-07-11 MED ORDER — CEFAZOLIN SODIUM-DEXTROSE 2-4 GM/100ML-% IV SOLN
2.0000 g | INTRAVENOUS | Status: DC
  Filled 2017-07-11: qty 100

## 2017-07-11 MED ORDER — ASPIRIN 81 MG PO CHEW: 81.0000 mg | CHEWABLE_TABLET | ORAL | Status: DC

## 2017-07-11 MED ORDER — SODIUM CHLORIDE 0.9 % IV SOLN: 250.0000 mL | INTRAVENOUS | Status: DC | PRN

## 2017-07-11 MED ORDER — HEPARIN SODIUM (PORCINE) 1000 UNIT/ML IJ SOLN
INTRAMUSCULAR | Status: DC | PRN
  Administered 2017-07-11: 5000 [IU] via INTRAVENOUS

## 2017-07-11 MED ORDER — ADENOSINE 6 MG/2ML IV SOLN
INTRAVENOUS | Status: AC
Start: 2017-07-11 — End: 2017-07-11
  Filled 2017-07-11: qty 2

## 2017-07-11 MED ORDER — SODIUM BICARBONATE 8.4 % IV SOLN
INTRAVENOUS | Status: AC
  Filled 2017-07-11: qty 50

## 2017-07-11 MED ORDER — HEPARIN (PORCINE) IN NACL 2-0.9 UNIT/ML-% IJ SOLN
INTRAMUSCULAR | Status: AC
  Filled 2017-07-11: qty 1000

## 2017-07-11 MED ORDER — FENTANYL CITRATE (PF) 100 MCG/2ML IJ SOLN
INTRAMUSCULAR | Status: AC
  Filled 2017-07-11: qty 2

## 2017-07-11 MED ORDER — ASPIRIN 81 MG PO CHEW: 81.0000 mg | CHEWABLE_TABLET | ORAL | Status: AC

## 2017-07-11 MED ORDER — CEFAZOLIN SODIUM-DEXTROSE 2-4 GM/100ML-% IV SOLN: 2.0000 g | INTRAVENOUS | Status: DC

## 2017-07-11 MED ORDER — SODIUM CHLORIDE 0.9% FLUSH: 3.0000 mL | INTRAVENOUS | Status: DC | PRN

## 2017-07-11 MED ORDER — VERAPAMIL HCL 2.5 MG/ML IV SOLN
INTRAVENOUS | Status: AC
  Filled 2017-07-11: qty 2

## 2017-07-11 MED ORDER — FENTANYL CITRATE (PF) 100 MCG/2ML IJ SOLN
INTRAMUSCULAR | Status: DC | PRN
  Administered 2017-07-11: 25 ug via INTRAVENOUS

## 2017-07-11 MED ORDER — SODIUM CHLORIDE 0.9 % WEIGHT BASED INFUSION: 3.0000 mL/kg/h | INTRAVENOUS | Status: AC

## 2017-07-11 MED ORDER — AMIODARONE HCL 150 MG/3ML IV SOLN
INTRAVENOUS | Status: AC
  Filled 2017-07-11: qty 6

## 2017-07-11 MED ORDER — HEPARIN (PORCINE) IN NACL 2-0.9 UNIT/ML-% IJ SOLN
INTRAMUSCULAR | Status: DC | PRN
  Administered 2017-07-11: 10 mL via INTRA_ARTERIAL

## 2017-07-11 MED ORDER — ASPIRIN 81 MG PO CHEW
CHEWABLE_TABLET | ORAL | Status: AC
  Filled 2017-07-11: qty 4

## 2017-07-11 MED ORDER — CALCIUM CHLORIDE 10 % IV SOLN
INTRAVENOUS | Status: AC
  Filled 2017-07-11: qty 10

## 2017-07-11 MED ORDER — ATROPINE SULFATE 1 MG/10ML IJ SOSY
PREFILLED_SYRINGE | INTRAMUSCULAR | Status: AC
  Filled 2017-07-11: qty 10

## 2017-07-11 SURGICAL SUPPLY — 10 items
CATH IMPULSE 5F ANG/FL3.5 (CATHETERS) ×1 IMPLANT
DEVICE RAD COMP TR BAND LRG (VASCULAR PRODUCTS) ×1 IMPLANT
GLIDESHEATH SLEND SS 6F .021 (SHEATH) ×1 IMPLANT
GUIDEWIRE INQWIRE 1.5J.035X260 (WIRE) IMPLANT
INQWIRE 1.5J .035X260CM (WIRE) ×2
KIT HEART LEFT (KITS) ×2 IMPLANT
PACK CARDIAC CATHETERIZATION (CUSTOM PROCEDURE TRAY) ×2 IMPLANT
SYR MEDRAD MARK V 150ML (SYRINGE) ×1 IMPLANT
TRANSDUCER W/STOPCOCK (MISCELLANEOUS) ×2 IMPLANT
TUBING CIL FLEX 10 FLL-RA (TUBING) ×2 IMPLANT

## 2017-07-11 NOTE — Progress Notes (Signed)
TR BAND REMOVAL  LOCATION:    Radial rt radial  DEFLATED PER PROTOCOL:   yes  TIME BAND OFF / DRESSING APPLIED:    1525, gauze and tegaderm  SITE UPON ARRIVAL:    Level 0  SITE AFTER BAND REMOVAL:    Level 0  CIRCULATION SENSATION AND MOVEMENT:    Within Normal Limits : yes, radial pulse 2+, hand and fingers warm and pink  COMMENTS:

## 2017-07-11 NOTE — Interval H&P Note (Signed)
History and Physical Interval Note:  07/11/2017 12:41 PM  John Forbes  has presented today for surgery, with the diagnosis of abnormal nuclear stress test  The various methods of treatment have been discussed with the patient and family. After consideration of risks, benefits and other options for treatment, the patient has consented to  Procedure(s): Left Heart Cath and Coronary Angiography (N/A) as a surgical intervention .  The patient's history has been reviewed, patient examined, no change in status, stable for surgery.  I have reviewed the patient's chart and labs.  Questions were answered to the patient's satisfaction.     Sherren Mocha

## 2017-07-11 NOTE — Progress Notes (Signed)
CC:  Abdominal pain  Subjective: Still some soreness RUQ. No significant discomfort.  Waiting on cardiac cath.  Objective: Vital signs in last 24 hours: Temp:  [98 F (36.7 C)-99 F (37.2 C)] 98.3 F (36.8 C) (07/25 0531) Pulse Rate:  [56-69] 56 (07/25 0531) Resp:  [18] 18 (07/25 0531) BP: (110-116)/(60-78) 112/65 (07/25 0531) SpO2:  [93 %-99 %] 93 % (07/25 0531) Last BM Date: 07/10/17 1320 3000 IV 1000 urine recorded Afebrile, VSS LFT's are better no labs this AM  ChemiThere was no ST segment deviation noted during stress.  There is a small defect present in the basal anteroseptal location. The defect is non-reversible.  There is a medium defect of moderate severity present in the basal inferior, mid inferior and apical inferior location. The defect is partially reversible and is concerning for inferior infarct with peri infarct ischemia but could also be due to variations in diaphragmatic attenuation.  This is an intermediate risk study.  Nuclear stress EF: 53%.  The left ventricular ejection fraction is mildly decreased (45-54%).   cal stress test:   Intake/Output from previous day: 07/24 0701 - 07/25 0700 In: 4244.2 [P.O.:1320; I.V.:2824.2; IV Piggyback:100] Out: 1000 [Urine:1000] Intake/Output this shift: Total I/O In: 0  Out: 400 [Urine:400]  General appearance: alert, cooperative and no distress Resp: clear to auscultation bilaterally GI: soft sore RUQ area.  Otherwise WNL  Lab Results:   Recent Labs  07/10/17 0607  WBC 8.3  HGB 12.0*  HCT 36.0*  PLT 128*    BMET  Recent Labs  07/09/17 0616 07/10/17 0607  NA 138 142  K 3.9 3.8  CL 108 112*  CO2 23 23  GLUCOSE 99 83  BUN 11 8  CREATININE 0.92 0.86  CALCIUM 8.4* 8.3*   PT/INR  Recent Labs  07/08/17 1034  LABPROT 12.9  INR 0.97     Recent Labs Lab 07/07/17 1509 07/07/17 2156 07/08/17 0534 07/09/17 0616 07/10/17 0607  AST 341* 292* 185* 58* 30  ALT 738* 685* 591*  319* 196*  ALKPHOS 170* 180* 186* 141* 106  BILITOT 4.1* 4.4* 1.6* 1.5* 1.4*  PROT 6.9 7.4 7.4 6.0* 5.8*  ALBUMIN 3.8 4.0 3.8 3.3* 2.9*     Lipase     Component Value Date/Time   LIPASE 36 07/10/2017 0607     Medications: . aspirin  81 mg Oral Pre-Cath  . aspirin EC  81 mg Oral Daily  . chlorhexidine  60 mL Topical Once  . isosorbide mononitrate  30 mg Oral Daily  . mouth rinse  15 mL Mouth Rinse BID  . metoprolol tartrate  25 mg Oral BID  . regadenoson  0.4 mg Intravenous Once  . sodium chloride flush  3 mL Intravenous Q12H   . sodium chloride 100 mL/hr at 07/11/17 0649  . sodium chloride    . sodium chloride     Followed by  . sodium chloride    . [START ON 07/12/2017]  ceFAZolin (ANCEF) IV    . famotidine (PEPCID) IV Stopped (07/10/17 2231)  . piperacillin-tazobactam (ZOSYN)  IV Stopped (07/11/17 4268)   Assessment/Plan Gallstone pancreatitis - lipase 5890 =>> 36 Elevated LFT - Improving bilirubin 4.4 =>> 1.4 E coli UTI  - >100K CAD - positive stress test COPD/emphysema - class 2 dyspnea/Hx of caustic lung injury - ongoing tobacco use Hx of GERD/dysphagia - EGD 8/11 FEN: IV fluids/NPO ID:  Ceftriaxone 1gm 07/07/17;  Zosyn 07/07/17-07/10/17  DVT:  SCD only - cardiac cath  today  Plan:  Cardiac cath today, If OK we can plan to do cholecystectomy tomorrow, if there is an issue we can discuss interval cholecystectomy.         LOS: 3 days    John Forbes 07/11/2017 [phone]

## 2017-07-11 NOTE — Progress Notes (Signed)
NT informed me that pt had a complete meal for dinner.  Went in to discuss with pt and he stated that he had a tuna sandwich and a bag of chips.  Explained risks to pt and explained not to eat anything other than liquids until midnight.  John Forbes

## 2017-07-11 NOTE — Progress Notes (Signed)
Progress Note  Patient Name: John Forbes Date of Encounter: 07/11/2017  Primary Cardiologist: T. Turner, MD   Subjective   No chest pain or sob.  For cardiac cath this afternoon.  Inpatient Medications    Scheduled Meds: . aspirin EC  81 mg Oral Daily  . chlorhexidine  60 mL Topical Once  . isosorbide mononitrate  30 mg Oral Daily  . mouth rinse  15 mL Mouth Rinse BID  . metoprolol tartrate  25 mg Oral BID  . regadenoson  0.4 mg Intravenous Once  . sodium chloride flush  3 mL Intravenous Q12H   Continuous Infusions: . sodium chloride 100 mL/hr at 07/11/17 0649  . sodium chloride    . sodium chloride    . [START ON 07/12/2017]  ceFAZolin (ANCEF) IV    . famotidine (PEPCID) IV 20 mg (07/11/17 0939)  . piperacillin-tazobactam (ZOSYN)  IV 3.375 g (07/11/17 1025)   PRN Meds: sodium chloride, ipratropium-albuterol, morphine injection, ondansetron **OR** ondansetron (ZOFRAN) IV, sodium chloride flush   Vital Signs    Vitals:   07/10/17 0907 07/10/17 1521 07/10/17 2110 07/11/17 0531  BP: 125/68 110/60 116/78 112/65  Pulse: 85 (!) 56 69 (!) 56  Resp:   18 18  Temp:  99 F (37.2 C) 98 F (36.7 C) 98.3 F (36.8 C)  TempSrc:  Axillary Oral Oral  SpO2:  99% 96% 93%  Weight:      Height:        Intake/Output Summary (Last 24 hours) at 07/11/17 1033 Last data filed at 07/11/17 0904  Gross per 24 hour  Intake          4244.17 ml  Output             1000 ml  Net          3244.17 ml   I/O since admission: +3458  [facility] Weights   07/07/17 2155 07/08/17 0222  Weight: 210 lb (95.3 kg) 204 lb 12.9 oz (92.9 kg)    Physical Exam   BP 112/65 (BP Location: Left Arm)   Pulse (!) 56   Temp 98.3 F (36.8 C) (Oral)   Resp 18   Ht 6' (1.829 m)   Wt 204 lb 12.9 oz (92.9 kg)   SpO2 93%   BMI 27.78 kg/m  General: Alert, oriented, no distress.  Skin: normal turgor, no rashes, warm and dry HEENT: Normocephalic, atraumatic. Pupils equal round and reactive to light;  sclera anicteric; extraocular muscles intact;Nose without nasal septal hypertrophy Mouth/Parynx benign; Mallinpatti scale 3 Neck: No JVD, no carotid bruits; normal carotid upstroke Lungs: clear to ausculatation and percussion; no wheezing or rales Chest wall: without tenderness to palpitation Heart: PMI not displaced, RRR, s1 s2 normal, 1/6 systolic murmur, no diastolic murmur, no rubs, gallops, thrills, or heaves Abdomen: soft, mild residual RUQ tenderness, BS+; abdominal aorta nontender and not dilated by palpation. Back: no CVA tenderness Pulses 2+ Musculoskeletal: full range of motion, normal strength, no joint deformities Extremities: no clubbing cyanosis or edema, Homan's sign negative  Neurologic: grossly nonfocal; Cranial nerves grossly wnl Psychologic: Normal mood and affect   Labs    Chemistry  Recent Labs Lab 07/08/17 0534 07/09/17 0616 07/10/17 0607  NA 139 138 142  K 4.6 3.9 3.8  CL 107 108 112*  CO2 24 23 23   GLUCOSE 134* 99 83  BUN 11 11 8   CREATININE 0.91 0.92 0.86  CALCIUM 8.8* 8.4* 8.3*  PROT 7.4 6.0* 5.8*  ALBUMIN  3.8 3.3* 2.9*  AST 185* 58* 30  ALT 591* 319* 196*  ALKPHOS 186* 141* 106  BILITOT 1.6* 1.5* 1.4*  GFRNONAA >60 >60 >60  GFRAA >60 >60 >60  ANIONGAP 8 7 7      Hematology  Recent Labs Lab 07/07/17 2156 07/08/17 0534 07/10/17 0607  WBC 12.8* 11.8* 8.3  RBC 5.08 4.87 3.99*  HGB 15.5 15.3 12.0*  HCT 46.3 44.2 36.0*  MCV 91.1 90.8 90.2  MCH 30.5 31.4 30.1  MCHC 33.5 34.6 33.3  RDW 15.4 15.3 14.9  PLT 178 169 128*    Lipid Panel     Component Value Date/Time   CHOL 167 07/09/2017 0616   TRIG 115 07/09/2017 0616   HDL 34 (L) 07/09/2017 0616   CHOLHDL 4.9 07/09/2017 0616   VLDL 23 07/09/2017 0616   LDLCALC 110 (H) 07/09/2017 0616   Radiology    Nm Myocar Multi W/spect W/wall Motion / Ef  Result Date: 07/10/2017  There was no ST segment deviation noted during stress.  There is a small defect present in the basal  anteroseptal location. The defect is non-reversible.  There is a medium defect of moderate severity present in the basal inferior, mid inferior and apical inferior location. The defect is partially reversible and is concerning for inferior infarct with peri infarct ischemia but could also be due to variations in diaphragmatic attenuation.  This is an intermediate risk study.  Nuclear stress EF: 53%.  The left ventricular ejection fraction is mildly decreased (45-54%).    In addition to the above finding which I agree by my personal review there is also mild lateral wall ischemia.  Telemetry    Sinus bradycardia - Personally Reviewed  Cardiac Studies   2D Echocardiogram 7.23.2018  Study Conclusions   - Left ventricle: The cavity size was normal. Wall thickness was   increased in a pattern of mild LVH. Systolic function was normal.   The estimated ejection fraction was in the range of 55% to 60%.   Wall motion was normal; there were no regional wall motion   abnormalities. Doppler parameters are consistent with abnormal   left ventricular relaxation (grade 1 diastolic dysfunction). The   E/e&' ratio is between 8-15, suggesting indeterminate LV filling   pressure. - Mitral valve: Mildly thickened leaflets . There was trivial   regurgitation. - Left atrium: The atrium was normal in size. - Right atrium: The atrium was mildly dilated. - Inferior vena cava: The vessel was normal in size. The   respirophasic diameter changes were in the normal range (>= 50%),   consistent with normal central venous pressure.   Impressions:   - Compared to a prior study in 2011, the LVEF is unchanged.   Patient Profile     56 y.o. male with a h/o CAD, HL, tob abuse, and GERD who was admitted 7/21 2/2 gallstone pancreatitis and cholecystitis.  Assessment & Plan    1.  Gallstone pancreatitis/cholecystitis: Still with RUQ tenderness; plan for lap chole after cardiac clearance with definitive cath  today.  Lipase 5890 => 36.  LFTs elevated but improving.  2. CAD:  No c/p overnight.    Echo showed nl EF w/o significant valvular dzs. Pt had class 2-3 anginal symptoms prior to admission. Nuclear study by my review shows inferior scar with additional ischemia as well as lateral ischemia EF 53%.  Definitive cath later today. The risks and benefits of a cardiac catheterization including, but not limited to, death, stroke, MI, kidney  damage and bleeding were discussed with the patient who indicates understanding and agrees to proceed.   3.  Tob Abuse:  Cessation advised.  4.  HL:  LDL 110.  Will ultimately need addition of statin but wait until LFTs are normal for some time prior to initiating.   Signed, Shelva Majestic, MD  07/11/2017, 10:33 AM

## 2017-07-11 NOTE — Progress Notes (Signed)
[facility] Gastroenterology Progress Note  John Forbes 56 y.o. 1961-03-28  CC:  Acute pancreatitis   Subjective: Patient was seen this morning. Continues to have epigastric pain. Denied any active chest pain. Is undergoing left heart cath today for abnormal nuclear stress test.  ROS : Negative for active chest pain and shortness of breath.   Objective: Vital signs in last 24 hours: Vitals:   07/10/17 2110 07/11/17 0531  BP: 116/78 112/65  Pulse: 69 (!) 56  Resp: 18 18  Temp: 98 F (36.7 C) 98.3 F (36.8 C)    Physical Exam:  General:  Alert, cooperative, no distress, appears stated age  Head:  Normocephalic, without obvious abnormality, atraumatic  Eyes:  , EOM's intact,   Lungs:   Clear to auscultation bilaterally, respirations unlabored  Heart:  Regular rate and rhythm, S1, S2 normal  Abdomen:   Soft,Epigastric tenderness to palpation, bowel sounds active all four quadrants,  no masses, no peritoneal signs   Extremities: Extremities normal, atraumatic, no  edema  Pulses: 2+ and symmetric    Lab Results:  Recent Labs  07/09/17 0616 07/10/17 0607  NA 138 142  K 3.9 3.8  CL 108 112*  CO2 23 23  GLUCOSE 99 83  BUN 11 8  CREATININE 0.92 0.86  CALCIUM 8.4* 8.3*  MG  --  1.8    Recent Labs  07/09/17 0616 07/10/17 0607  AST 58* 30  ALT 319* 196*  ALKPHOS 141* 106  BILITOT 1.5* 1.4*  PROT 6.0* 5.8*  ALBUMIN 3.3* 2.9*    Recent Labs  07/10/17 0607  WBC 8.3  HGB 12.0*  HCT 36.0*  MCV 90.2  PLT 128*   No results for input(s): LABPROT, INR in the last 72 hours.    Assessment/Plan: - Acute pancreatitis. Most likely from gallstone. Not able to get MRCP done. - Elevated LFTs. Could be from passed  CBD stone.LFTs improving - Abnormal nuclear stress test. - Due for surveillance colonoscopy  Recommendations ----------------------- - Plan for LHC today.  - Patient  will need laparoscopic cholecystectomy once cleared from cardiac standpoint.  Recommend IOC for evaluation of possible common bile duct stones. ERCP only if positive IOC. - Recommend outpatient colonoscopy once acute issues are resolved. -  GI will follow periodically.   Otis Brace MD, [place] 07/11/2017, 1:31 PM  Pager [phone]  If no answer or after 5 PM call [phone]

## 2017-07-11 NOTE — Progress Notes (Signed)
[facility] PROGRESS NOTE  John Forbes:[id number] DOB: Mar 13, 1961 DOA: 07/07/2017  PCP: Patient, [name]  Brief History/Interval Summary: 56 year old Caucasian male with a past medical history of coronary artery disease with stent placement, history of in-stent restenosis, history of rheumatic fever, COPD, hiatal hernia, presented with abdominal pain. Found to have biliary pancreatitis. Patient was hospitalized for further management.  Reason for Visit: Acute pancreatitis.  Consultants: Gastroenterology. Surgery. Cardiology  Procedures:  Transthoracic echocardiogram Study Conclusions - Left ventricle: The cavity size was normal. Wall thickness was   increased in a pattern of mild LVH. Systolic function was normal.   The estimated ejection fraction was in the range of 55% to 60%.   Wall motion was normal; there were no regional wall motion   abnormalities. Doppler parameters are consistent with abnormal   left ventricular relaxation (grade 1 diastolic dysfunction). The   E/e&' ratio is between 8-15, suggesting indeterminate LV filling   pressure. - Mitral valve: Mildly thickened leaflets . There was trivial   regurgitation. - Left atrium: The atrium was normal in size. - Right atrium: The atrium was mildly dilated. - Inferior vena cava: The vessel was normal in size. The   respirophasic diameter changes were in the normal range (>= 50%),   consistent with normal central venous pressure. Impressions: - Compared to a prior study in 2011, the LVEF is unchanged.  Nuclear Stress test Study Result    There was no ST segment deviation noted during stress.  There is a small defect present in the basal anteroseptal location. The defect is non-reversible.  There is a medium defect of moderate severity present in the basal inferior, mid inferior and apical inferior location. The defect is partially reversible and is concerning for inferior infarct with peri infarct  ischemia but could also be due to variations in diaphragmatic attenuation.  This is an intermediate risk study.  Nuclear stress EF: 53%.  The left ventricular ejection fraction is mildly decreased (45-54%).   Cardiac catheterization Conclusion   1. Severe single-vessel coronary artery disease involving the mid right coronary artery 2. Continued patency of the stented segment in the right PDA with moderate focal proximal edge restenosis 3. Mild to moderate left circumflex/first OM stenosis 4. Mild nonobstructive LAD stenosis with severe stenosis of a tiny diagonal branch  Recommendations: Discussed case and reviewed films with Dr. Claiborne Billings. The patient does not have symptoms of unstable angina and he has noncritical coronary artery disease. His films are compared to his previous study from 2014. There is progressive stenosis in the mid right coronary artery and PCI would be appropriate. However, the patient has gallstone pancreatitis and will require laparoscopic cholecystectomy. It seems most prudent to proceed with his surgery on medical therapy for coronary artery disease as planned tomorrow. His PCI can be done electively after he recovers from surgery. PCI prior to surgery would likely result in surgical delay and potential complications related to his gallstone pancreatitis. He is on good medical therapy with metoprolol and isosorbide and appears to be well beta blocked at present. Case also discussed with the surgical team, Dr. Kae Heller.     Antibiotics: None  Subjective/Interval History: Patient continues to have some discomfort in his abdomen. Denies any nausea, vomiting. Denies any chest pain or shortness of breath.  ROS: No headache  Objective:  Vital Signs  Vitals:   07/11/17 1317 07/11/17 1322 07/11/17 1327 07/11/17 1332  BP: 114/74     Pulse: 96 (!) 0 (!)  0 (!) 0  Resp: (!) 24 (!) 0 (!) 0 (!) 0  Temp:      TempSrc:      SpO2: (!) 0% (!) 0% (!) 0% (!) 0%  Weight:        Height:        Intake/Output Summary (Last 24 hours) at 07/11/17 1339 Last data filed at 07/11/17 5329  Gross per 24 hour  Intake          3884.17 ml  Output              800 ml  Net          3084.17 ml   Filed Weights   07/07/17 2155 07/08/17 0222  Weight: 95.3 kg (210 lb) 92.9 kg (204 lb 12.9 oz)    General appearance: alert, cooperative, appears stated age and no distress Resp: clear to auscultation bilaterally Cardio: regular rate and rhythm, S1, S2 normal, no murmur, click, rub or gallop GI: Mildly tender in the epigastric area without any rebound, rigidity or guarding. No masses, organomegaly Extremities: extremities normal, atraumatic, no cyanosis or edema Neurologic: No focal deficits  Lab Results:  Data Reviewed: I have personally reviewed following labs and imaging studies  CBC:  Recent Labs Lab 07/07/17 1509 07/07/17 2156 07/08/17 0534 07/10/17 0607  WBC 6.4 12.8* 11.8* 8.3  HGB 14.7 15.5 15.3 12.0*  HCT 44.4 46.3 44.2 36.0*  MCV 91.7 91.1 90.8 90.2  PLT 178 178 169 128*    Basic Metabolic Panel:  Recent Labs Lab 07/07/17 1509 07/07/17 2156 07/08/17 0534 07/09/17 0616 07/10/17 0607  NA 135 137 139 138 142  K 4.3 3.9 4.6 3.9 3.8  CL 105 105 107 108 112*  CO2 22 21* 24 23 23   GLUCOSE 95 121* 134* 99 83  BUN 11 10 11 11 8   CREATININE 0.90 1.02 0.91 0.92 0.86  CALCIUM 8.9 9.0 8.8* 8.4* 8.3*  MG  --   --   --   --  1.8    GFR: Estimated Creatinine Clearance: 105.3 mL/min (by C-G formula based on SCr of 0.86 mg/dL).  Liver Function Tests:  Recent Labs Lab 07/07/17 1509 07/07/17 2156 07/08/17 0534 07/09/17 0616 07/10/17 0607  AST 341* 292* 185* 58* 30  ALT 738* 685* 591* 319* 196*  ALKPHOS 170* 180* 186* 141* 106  BILITOT 4.1* 4.4* 1.6* 1.5* 1.4*  PROT 6.9 7.4 7.4 6.0* 5.8*  ALBUMIN 3.8 4.0 3.8 3.3* 2.9*     Recent Labs Lab 07/07/17 1509 07/07/17 2156 07/08/17 1034 07/09/17 0616 07/10/17 0607  LIPASE 27 5,890* 870* 90* 36     Coagulation Profile:  Recent Labs Lab 07/08/17 1034  INR 0.97    Lipid Profile:  Recent Labs  07/09/17 0616  CHOL 167  HDL 34*  LDLCALC 110*  TRIG 115  CHOLHDL 4.9     Recent Results (from the past 240 hour(s))  Urine culture     Status: Abnormal   Collection Time: 07/07/17  4:43 PM  Result Value Ref Range Status   Specimen Description URINE, RANDOM  Final   Special Requests NONE  Final   Culture (A)  Final    >=100,000 COLONIES/mL ESCHERICHIA COLI Confirmed Extended Spectrum Beta-Lactamase Producer (ESBL)    Report Status 07/09/2017 FINAL  Final   Organism ID, Bacteria ESCHERICHIA COLI (A)  Final      Susceptibility   Escherichia coli - MIC*    AMPICILLIN >=32 RESISTANT Resistant     CEFAZOLIN >=  64 RESISTANT Resistant     CEFTRIAXONE >=64 RESISTANT Resistant     CIPROFLOXACIN <=0.25 SENSITIVE Sensitive     GENTAMICIN <=1 SENSITIVE Sensitive     IMIPENEM <=0.25 SENSITIVE Sensitive     NITROFURANTOIN <=16 SENSITIVE Sensitive     TRIMETH/SULFA <=20 SENSITIVE Sensitive     AMPICILLIN/SULBACTAM 16 INTERMEDIATE Intermediate     PIP/TAZO <=4 SENSITIVE Sensitive     Extended ESBL POSITIVE Resistant     * >=100,000 COLONIES/mL ESCHERICHIA COLI  Surgical pcr screen     Status: None   Collection Time: 07/09/17 11:40 PM  Result Value Ref Range Status   MRSA, PCR NEGATIVE NEGATIVE Final   Staphylococcus aureus NEGATIVE NEGATIVE Final    Comment:        The Xpert SA Assay (FDA approved for NASAL specimens in patients over 43 years of age), is one component of a comprehensive surveillance program.  Test performance has been validated by [facility] for patients greater than or equal to 64 year old. It is not intended to diagnose infection nor to guide or monitor treatment.       Radiology Studies: Nm Myocar Multi W/spect W/wall Motion / Ef  Result Date: 07/10/2017  There was no ST segment deviation noted during stress.  There is a small defect present  in the basal anteroseptal location. The defect is non-reversible.  There is a medium defect of moderate severity present in the basal inferior, mid inferior and apical inferior location. The defect is partially reversible and is concerning for inferior infarct with peri infarct ischemia but could also be due to variations in diaphragmatic attenuation.  This is an intermediate risk study.  Nuclear stress EF: 53%.  The left ventricular ejection fraction is mildly decreased (45-54%).      Medications:  Scheduled: . [MAR Hold] aspirin EC  81 mg Oral Daily  . chlorhexidine  60 mL Topical Once  . [MAR Hold] isosorbide mononitrate  30 mg Oral Daily  . [MAR Hold] mouth rinse  15 mL Mouth Rinse BID  . [MAR Hold] metoprolol tartrate  25 mg Oral BID  . [MAR Hold] regadenoson  0.4 mg Intravenous Once  . sodium chloride flush  3 mL Intravenous Q12H   Continuous: . sodium chloride 100 mL/hr at 07/11/17 0649  . sodium chloride    . sodium chloride    . sodium chloride 1 mL/kg/hr (07/11/17 1343)  . [START ON 07/12/2017]  ceFAZolin (ANCEF) IV    . [MAR Hold] famotidine (PEPCID) IV Stopped (07/11/17 1009)  . [MAR Hold] piperacillin-tazobactam (ZOSYN)  IV 3.375 g (07/11/17 1025)   YQM:VHQION chloride, [MAR Hold] ipratropium-albuterol, [MAR Hold]  morphine injection, [MAR Hold] ondansetron **OR** [MAR Hold] ondansetron (ZOFRAN) IV, sodium chloride flush  Assessment/Plan:  Principal Problem:   Gallstone pancreatitis Active Problems:   TOBACCO ABUSE   COPD (chronic obstructive pulmonary disease) (HCC)   Coronary artery disease involving native coronary artery of native heart with unstable angina pectoris (HCC)   Acute cholecystitis   Abdominal pain   LFTs abnormal   Chest pain with moderate risk for cardiac etiology   Preop cardiovascular exam   Abnormal nuclear stress test    Acute biliary pancreatitis/concern for cholangitis/cholecystitis Patient seen by gastroenterology and general  surgery. LFTs are improved. Patient continues to have some symptoms. Plan is for cholecystectomy once cleared by cardiology. Cardiology is seeing the patient. He underwent stress test which was abnormal. Plan is for cardiac catheterization today. Continue Zosyn.  History of coronary  artery disease Patient with history of stent in the past and also history of in-stent restenosis in the past. Positive stress test results noted. Plan is for cardiac catheterization today. Continue aspirin, beta blocker. Should be on statin as well which can be initiated once LFTs are completely normal.  History of COPD No acute exacerbation. Stable currently.  History of tobacco abuse. Counseled. Patient declined nicotine patch.  Abnormal UA Urine culture positive for Escherichia coli. Not thought to be a true infection. He is on antibiotics.  Chronic low back pain Stable   DVT Prophylaxis: SCDs    Code Status: Full code  Family Communication: Discussed with the patient  Disposition Plan: Management as outlined above.    LOS: 3 days   [facility] Pager [phone] 07/11/2017, 1:39 PM  If 7PM-7AM, please contact night-coverage at [email or web address], password [facility]

## 2017-07-11 NOTE — H&P (View-Only) (Signed)
Progress Note  Patient Name: John Forbes Date of Encounter: 07/11/2017  Primary Cardiologist: T. Turner, MD   Subjective   No chest pain or sob.  For cardiac cath this afternoon.  Inpatient Medications    Scheduled Meds: . aspirin EC  81 mg Oral Daily  . chlorhexidine  60 mL Topical Once  . isosorbide mononitrate  30 mg Oral Daily  . mouth rinse  15 mL Mouth Rinse BID  . metoprolol tartrate  25 mg Oral BID  . regadenoson  0.4 mg Intravenous Once  . sodium chloride flush  3 mL Intravenous Q12H   Continuous Infusions: . sodium chloride 100 mL/hr at 07/11/17 0649  . sodium chloride    . sodium chloride    . [START ON 07/12/2017]  ceFAZolin (ANCEF) IV    . famotidine (PEPCID) IV 20 mg (07/11/17 0939)  . piperacillin-tazobactam (ZOSYN)  IV 3.375 g (07/11/17 1025)   PRN Meds: sodium chloride, ipratropium-albuterol, morphine injection, ondansetron **OR** ondansetron (ZOFRAN) IV, sodium chloride flush   Vital Signs    Vitals:   07/10/17 0907 07/10/17 1521 07/10/17 2110 07/11/17 0531  BP: 125/68 110/60 116/78 112/65  Pulse: 85 (!) 56 69 (!) 56  Resp:   18 18  Temp:  99 F (37.2 C) 98 F (36.7 C) 98.3 F (36.8 C)  TempSrc:  Axillary Oral Oral  SpO2:  99% 96% 93%  Weight:      Height:        Intake/Output Summary (Last 24 hours) at 07/11/17 1033 Last data filed at 07/11/17 0904  Gross per 24 hour  Intake          4244.17 ml  Output             1000 ml  Net          3244.17 ml   I/O since admission: +3458  [facility] Weights   07/07/17 2155 07/08/17 0222  Weight: 210 lb (95.3 kg) 204 lb 12.9 oz (92.9 kg)    Physical Exam   BP 112/65 (BP Location: Left Arm)   Pulse (!) 56   Temp 98.3 F (36.8 C) (Oral)   Resp 18   Ht 6' (1.829 m)   Wt 204 lb 12.9 oz (92.9 kg)   SpO2 93%   BMI 27.78 kg/m  General: Alert, oriented, no distress.  Skin: normal turgor, no rashes, warm and dry HEENT: Normocephalic, atraumatic. Pupils equal round and reactive to light;  sclera anicteric; extraocular muscles intact;Nose without nasal septal hypertrophy Mouth/Parynx benign; Mallinpatti scale 3 Neck: No JVD, no carotid bruits; normal carotid upstroke Lungs: clear to ausculatation and percussion; no wheezing or rales Chest wall: without tenderness to palpitation Heart: PMI not displaced, RRR, s1 s2 normal, 1/6 systolic murmur, no diastolic murmur, no rubs, gallops, thrills, or heaves Abdomen: soft, mild residual RUQ tenderness, BS+; abdominal aorta nontender and not dilated by palpation. Back: no CVA tenderness Pulses 2+ Musculoskeletal: full range of motion, normal strength, no joint deformities Extremities: no clubbing cyanosis or edema, Homan's sign negative  Neurologic: grossly nonfocal; Cranial nerves grossly wnl Psychologic: Normal mood and affect   Labs    Chemistry  Recent Labs Lab 07/08/17 0534 07/09/17 0616 07/10/17 0607  NA 139 138 142  K 4.6 3.9 3.8  CL 107 108 112*  CO2 24 23 23   GLUCOSE 134* 99 83  BUN 11 11 8   CREATININE 0.91 0.92 0.86  CALCIUM 8.8* 8.4* 8.3*  PROT 7.4 6.0* 5.8*  ALBUMIN  3.8 3.3* 2.9*  AST 185* 58* 30  ALT 591* 319* 196*  ALKPHOS 186* 141* 106  BILITOT 1.6* 1.5* 1.4*  GFRNONAA >60 >60 >60  GFRAA >60 >60 >60  ANIONGAP 8 7 7      Hematology  Recent Labs Lab 07/07/17 2156 07/08/17 0534 07/10/17 0607  WBC 12.8* 11.8* 8.3  RBC 5.08 4.87 3.99*  HGB 15.5 15.3 12.0*  HCT 46.3 44.2 36.0*  MCV 91.1 90.8 90.2  MCH 30.5 31.4 30.1  MCHC 33.5 34.6 33.3  RDW 15.4 15.3 14.9  PLT 178 169 128*    Lipid Panel     Component Value Date/Time   CHOL 167 07/09/2017 0616   TRIG 115 07/09/2017 0616   HDL 34 (L) 07/09/2017 0616   CHOLHDL 4.9 07/09/2017 0616   VLDL 23 07/09/2017 0616   LDLCALC 110 (H) 07/09/2017 0616   Radiology    Nm Myocar Multi W/spect W/wall Motion / Ef  Result Date: 07/10/2017  There was no ST segment deviation noted during stress.  There is a small defect present in the basal  anteroseptal location. The defect is non-reversible.  There is a medium defect of moderate severity present in the basal inferior, mid inferior and apical inferior location. The defect is partially reversible and is concerning for inferior infarct with peri infarct ischemia but could also be due to variations in diaphragmatic attenuation.  This is an intermediate risk study.  Nuclear stress EF: 53%.  The left ventricular ejection fraction is mildly decreased (45-54%).    In addition to the above finding which I agree by my personal review there is also mild lateral wall ischemia.  Telemetry    Sinus bradycardia - Personally Reviewed  Cardiac Studies   2D Echocardiogram 7.23.2018  Study Conclusions   - Left ventricle: The cavity size was normal. Wall thickness was   increased in a pattern of mild LVH. Systolic function was normal.   The estimated ejection fraction was in the range of 55% to 60%.   Wall motion was normal; there were no regional wall motion   abnormalities. Doppler parameters are consistent with abnormal   left ventricular relaxation (grade 1 diastolic dysfunction). The   E/e&' ratio is between 8-15, suggesting indeterminate LV filling   pressure. - Mitral valve: Mildly thickened leaflets . There was trivial   regurgitation. - Left atrium: The atrium was normal in size. - Right atrium: The atrium was mildly dilated. - Inferior vena cava: The vessel was normal in size. The   respirophasic diameter changes were in the normal range (>= 50%),   consistent with normal central venous pressure.   Impressions:   - Compared to a prior study in 2011, the LVEF is unchanged.   Patient Profile     56 y.o. male with a h/o CAD, HL, tob abuse, and GERD who was admitted 7/21 2/2 gallstone pancreatitis and cholecystitis.  Assessment & Plan    1.  Gallstone pancreatitis/cholecystitis: Still with RUQ tenderness; plan for lap chole after cardiac clearance with definitive cath  today.  Lipase 5890 => 36.  LFTs elevated but improving.  2. CAD:  No c/p overnight.    Echo showed nl EF w/o significant valvular dzs. Pt had class 2-3 anginal symptoms prior to admission. Nuclear study by my review shows inferior scar with additional ischemia as well as lateral ischemia EF 53%.  Definitive cath later today. The risks and benefits of a cardiac catheterization including, but not limited to, death, stroke, MI, kidney  damage and bleeding were discussed with the patient who indicates understanding and agrees to proceed.   3.  Tob Abuse:  Cessation advised.  4.  HL:  LDL 110.  Will ultimately need addition of statin but wait until LFTs are normal for some time prior to initiating.   Signed, Shelva Majestic, MD  07/11/2017, 10:33 AM

## 2017-07-12 ENCOUNTER — Encounter (HOSPITAL_COMMUNITY)
Admission: EM | Disposition: A | Discharge status: Home / Self Care | Payer: Self-pay | Source: Home / Self Care | Attending: Family Medicine

## 2017-07-12 ENCOUNTER — Inpatient Hospital Stay (HOSPITAL_COMMUNITY): Payer: Medicare Other | Admitting: Anesthesiology

## 2017-07-12 ENCOUNTER — Encounter (HOSPITAL_COMMUNITY): Payer: Self-pay | Admitting: Cardiovascular Disease

## 2017-07-12 HISTORY — PX: CHOLECYSTECTOMY: SHX55

## 2017-07-12 LAB — COMPREHENSIVE METABOLIC PANEL
ALT: 108 U/L — ABNORMAL HIGH (ref 17–63)
ANION GAP: 5 (ref 5–15)
AST: 17 U/L (ref 15–41)
Albumin: 2.8 g/dL — ABNORMAL LOW (ref 3.5–5.0)
Alkaline Phosphatase: 80 U/L (ref 38–126)
BUN: 6 mg/dL (ref 6–20)
CHLORIDE: 114 mmol/L — AB (ref 101–111)
CO2: 24 mmol/L (ref 22–32)
Calcium: 8.2 mg/dL — ABNORMAL LOW (ref 8.9–10.3)
Creatinine, Ser: 0.81 mg/dL (ref 0.61–1.24)
Glucose, Bld: 118 mg/dL — ABNORMAL HIGH (ref 65–99)
POTASSIUM: 3.7 mmol/L (ref 3.5–5.1)
Sodium: 143 mmol/L (ref 135–145)
Total Bilirubin: 0.5 mg/dL (ref 0.3–1.2)
Total Protein: 5.7 g/dL — ABNORMAL LOW (ref 6.5–8.1)

## 2017-07-12 LAB — CBC
HCT: 35 % — ABNORMAL LOW (ref 39.0–52.0)
Hemoglobin: 11.6 g/dL — ABNORMAL LOW (ref 13.0–17.0)
MCH: 30 pg (ref 26.0–34.0)
MCHC: 33.1 g/dL (ref 30.0–36.0)
MCV: 90.4 fL (ref 78.0–100.0)
PLATELETS: 173 10*3/uL (ref 150–400)
RBC: 3.87 MIL/uL — ABNORMAL LOW (ref 4.22–5.81)
RDW: 15.3 % (ref 11.5–15.5)
WBC: 6.7 10*3/uL (ref 4.0–10.5)

## 2017-07-12 SURGERY — LAPAROSCOPIC CHOLECYSTECTOMY
Anesthesia: General | Site: Abdomen

## 2017-07-12 MED ORDER — ROCURONIUM BROMIDE 10 MG/ML (PF) SYRINGE
PREFILLED_SYRINGE | INTRAVENOUS | Status: AC
  Filled 2017-07-12: qty 5

## 2017-07-12 MED ORDER — EPHEDRINE SULFATE 50 MG/ML IJ SOLN
INTRAMUSCULAR | Status: DC | PRN
  Administered 2017-07-12: 5 mg via INTRAVENOUS

## 2017-07-12 MED ORDER — SUGAMMADEX SODIUM 200 MG/2ML IV SOLN
INTRAVENOUS | Status: DC | PRN
  Administered 2017-07-12: 200 mg via INTRAVENOUS

## 2017-07-12 MED ORDER — FENTANYL CITRATE (PF) 100 MCG/2ML IJ SOLN
INTRAMUSCULAR | Status: DC | PRN
  Administered 2017-07-12: 50 ug via INTRAVENOUS
  Administered 2017-07-12: 200 ug via INTRAVENOUS

## 2017-07-12 MED ORDER — MIDAZOLAM HCL 2 MG/2ML IJ SOLN
INTRAMUSCULAR | Status: AC
  Filled 2017-07-12: qty 2

## 2017-07-12 MED ORDER — BUPIVACAINE-EPINEPHRINE (PF) 0.25% -1:200000 IJ SOLN
INTRAMUSCULAR | Status: AC
  Filled 2017-07-12: qty 30

## 2017-07-12 MED ORDER — ROCURONIUM BROMIDE 100 MG/10ML IV SOLN
INTRAVENOUS | Status: DC | PRN
  Administered 2017-07-12: 20 mg via INTRAVENOUS
  Administered 2017-07-12: 60 mg via INTRAVENOUS

## 2017-07-12 MED ORDER — OXYCODONE HCL 5 MG PO TABS
5.0000 mg | ORAL_TABLET | ORAL | Status: DC | PRN
  Administered 2017-07-12 – 2017-07-13 (×4): 5 mg via ORAL
  Filled 2017-07-12 (×4): qty 1

## 2017-07-12 MED ORDER — PROPOFOL 10 MG/ML IV BOLUS
INTRAVENOUS | Status: DC | PRN
  Administered 2017-07-12: 200 mg via INTRAVENOUS

## 2017-07-12 MED ORDER — EPHEDRINE 5 MG/ML INJ
INTRAVENOUS | Status: AC
  Filled 2017-07-12: qty 10

## 2017-07-12 MED ORDER — ONDANSETRON HCL 4 MG/2ML IJ SOLN
INTRAMUSCULAR | Status: AC
  Filled 2017-07-12: qty 2

## 2017-07-12 MED ORDER — ONDANSETRON HCL 4 MG/2ML IJ SOLN
INTRAMUSCULAR | Status: DC | PRN
  Administered 2017-07-12: 4 mg via INTRAVENOUS

## 2017-07-12 MED ORDER — FENTANYL CITRATE (PF) 250 MCG/5ML IJ SOLN
INTRAMUSCULAR | Status: AC
  Filled 2017-07-12: qty 5

## 2017-07-12 MED ORDER — DEXAMETHASONE SODIUM PHOSPHATE 10 MG/ML IJ SOLN
INTRAMUSCULAR | Status: DC | PRN
  Administered 2017-07-12: 10 mg via INTRAVENOUS

## 2017-07-12 MED ORDER — PROPOFOL 10 MG/ML IV BOLUS
INTRAVENOUS | Status: AC
  Filled 2017-07-12: qty 20

## 2017-07-12 MED ORDER — DEXAMETHASONE SODIUM PHOSPHATE 10 MG/ML IJ SOLN
INTRAMUSCULAR | Status: AC
  Filled 2017-07-12: qty 1

## 2017-07-12 MED ORDER — 0.9 % SODIUM CHLORIDE (POUR BTL) OPTIME
TOPICAL | Status: DC | PRN
  Administered 2017-07-12: 1000 mL

## 2017-07-12 MED ORDER — SODIUM CHLORIDE 0.9 % IR SOLN
Status: DC | PRN
  Administered 2017-07-12: 1000 mL

## 2017-07-12 MED ORDER — LACTATED RINGERS IV SOLN: INTRAVENOUS | Status: DC

## 2017-07-12 MED ORDER — BUPIVACAINE-EPINEPHRINE 0.25% -1:200000 IJ SOLN
INTRAMUSCULAR | Status: DC | PRN
  Administered 2017-07-12: 10 mL

## 2017-07-12 MED ORDER — IOPAMIDOL (ISOVUE-300) INJECTION 61%
INTRAVENOUS | Status: AC
  Filled 2017-07-12: qty 50

## 2017-07-12 MED ORDER — MIDAZOLAM HCL 5 MG/5ML IJ SOLN
INTRAMUSCULAR | Status: DC | PRN
  Administered 2017-07-12: 2 mg via INTRAVENOUS

## 2017-07-12 MED ORDER — SUGAMMADEX SODIUM 200 MG/2ML IV SOLN
INTRAVENOUS | Status: AC
  Filled 2017-07-12: qty 2

## 2017-07-12 MED ORDER — LACTATED RINGERS IV SOLN
INTRAVENOUS | Status: DC | PRN
  Administered 2017-07-12: 10:00:00 via INTRAVENOUS

## 2017-07-12 MED ORDER — LIDOCAINE 2% (20 MG/ML) 5 ML SYRINGE
INTRAMUSCULAR | Status: AC
  Filled 2017-07-12: qty 5

## 2017-07-12 MED ORDER — DOCUSATE SODIUM 100 MG PO CAPS
100.0000 mg | ORAL_CAPSULE | Freq: Two times a day (BID) | ORAL | Status: DC
  Administered 2017-07-12 – 2017-07-13 (×2): 100 mg via ORAL
  Filled 2017-07-12 (×2): qty 1

## 2017-07-12 SURGICAL SUPPLY — 39 items
ADH SKN CLS APL DERMABOND .7 (GAUZE/BANDAGES/DRESSINGS) ×1
APPLIER CLIP 5 13 M/L LIGAMAX5 (MISCELLANEOUS) ×3
APR CLP MED LRG 5 ANG JAW (MISCELLANEOUS) ×1
BAG SPEC RTRVL LRG 6X4 10 (ENDOMECHANICALS) ×1
BLADE CLIPPER SURG (BLADE) IMPLANT
CANISTER SUCT 3000ML PPV (MISCELLANEOUS) ×3 IMPLANT
CHLORAPREP W/TINT 26ML (MISCELLANEOUS) ×3 IMPLANT
CLIP APPLIE 5 13 M/L LIGAMAX5 (MISCELLANEOUS) ×1 IMPLANT
COVER SURGICAL LIGHT HANDLE (MISCELLANEOUS) ×3 IMPLANT
DERMABOND ADVANCED (GAUZE/BANDAGES/DRESSINGS) ×2
DERMABOND ADVANCED .7 DNX12 (GAUZE/BANDAGES/DRESSINGS) ×1 IMPLANT
ELECT REM PT RETURN 9FT ADLT (ELECTROSURGICAL) ×3
ELECTRODE REM PT RTRN 9FT ADLT (ELECTROSURGICAL) ×1 IMPLANT
ENDOLOOP SUT PDS II  0 18 (SUTURE) ×2
ENDOLOOP SUT PDS II 0 18 (SUTURE) IMPLANT
GLOVE BIO SURGEON STRL SZ 6 (GLOVE) ×3 IMPLANT
GLOVE BIOGEL PI IND STRL 6.5 (GLOVE) ×1 IMPLANT
GLOVE BIOGEL PI INDICATOR 6.5 (GLOVE) ×2
GOWN STRL REUS W/ TWL LRG LVL3 (GOWN DISPOSABLE) ×3 IMPLANT
GOWN STRL REUS W/TWL LRG LVL3 (GOWN DISPOSABLE) ×9
GRASPER SUT TROCAR 14GX15 (MISCELLANEOUS) ×3 IMPLANT
KIT BASIN OR (CUSTOM PROCEDURE TRAY) ×3 IMPLANT
KIT ROOM TURNOVER OR (KITS) ×3 IMPLANT
NDL INSUFFLATION 14GA 120MM (NEEDLE) ×1 IMPLANT
NEEDLE INSUFFLATION 14GA 120MM (NEEDLE) ×3 IMPLANT
NS IRRIG 1000ML POUR BTL (IV SOLUTION) ×3 IMPLANT
PAD ARMBOARD 7.5X6 YLW CONV (MISCELLANEOUS) ×3 IMPLANT
POUCH SPECIMEN RETRIEVAL 10MM (ENDOMECHANICALS) ×3 IMPLANT
SCISSORS LAP 5X35 DISP (ENDOMECHANICALS) ×3 IMPLANT
SET IRRIG TUBING LAPAROSCOPIC (IRRIGATION / IRRIGATOR) ×3 IMPLANT
SLEEVE ENDOPATH XCEL 5M (ENDOMECHANICALS) ×6 IMPLANT
SPECIMEN JAR SMALL (MISCELLANEOUS) ×3 IMPLANT
SUT MNCRL AB 4-0 PS2 18 (SUTURE) ×3 IMPLANT
TOWEL OR 17X24 6PK STRL BLUE (TOWEL DISPOSABLE) ×3 IMPLANT
TOWEL OR 17X26 10 PK STRL BLUE (TOWEL DISPOSABLE) IMPLANT
TRAY LAPAROSCOPIC MC (CUSTOM PROCEDURE TRAY) ×3 IMPLANT
TROCAR XCEL NON-BLD 11X100MML (ENDOMECHANICALS) ×3 IMPLANT
TROCAR XCEL NON-BLD 5MMX100MML (ENDOMECHANICALS) ×3 IMPLANT
TUBING INSUFFLATION (TUBING) ×3 IMPLANT

## 2017-07-12 NOTE — Progress Notes (Signed)
  CC:  Abdominal pain  Subjective: Ongoing mild epigastric pain. No nausea. Cath yesterday with intervenable lesion but cardiology recommends proceed with lap chole on medical management with outpt PCI once he is recovered. Ready for surgery today.  Objective: Vital signs in last 24 hours: Temp:  [98.1 F (36.7 C)-98.2 F (36.8 C)] 98.1 F (36.7 C) (07/26 0400) Pulse Rate:  [0-275] 65 (07/25 2124) Resp:  [0-44] 18 (07/25 2124) BP: (99-140)/(61-78) 119/68 (07/26 0844) SpO2:  [0 %-98 %] 98 % (07/25 2124) Weight:  [92.9 kg (204 lb 12.9 oz)] 92.9 kg (204 lb 12.9 oz) (07/26 0927) Last BM Date: 07/11/17   Intake/Output from previous day: 07/25 0701 - 07/26 0700 In: 840 [P.O.:840] Out: 400 [Urine:400] Intake/Output this shift: Total I/O In: 3 [I.V.:3] Out: -   General appearance: alert, cooperative and no distress Resp: clear to auscultation bilaterally GI: soft sore RUQ area.  Otherwise WNL  Lab Results:   Recent Labs  07/10/17 0607 07/12/17 0439  WBC 8.3 6.7  HGB 12.0* 11.6*  HCT 36.0* 35.0*  PLT 128* 173    BMET  Recent Labs  07/10/17 0607 07/12/17 0439  NA 142 143  K 3.8 3.7  CL 112* 114*  CO2 23 24  GLUCOSE 83 118*  BUN 8 6  CREATININE 0.86 0.81  CALCIUM 8.3* 8.2*   PT/INR No results for input(s): LABPROT, INR in the last 72 hours.   Recent Labs Lab 07/07/17 2156 07/08/17 0534 07/09/17 0616 07/10/17 0607 07/12/17 0439  AST 292* 185* 58* 30 17  ALT 685* 591* 319* 196* 108*  ALKPHOS 180* 186* 141* 106 80  BILITOT 4.4* 1.6* 1.5* 1.4* 0.5  PROT 7.4 7.4 6.0* 5.8* 5.7*  ALBUMIN 4.0 3.8 3.3* 2.9* 2.8*     Lipase     Component Value Date/Time   LIPASE 36 07/10/2017 0607     Medications: . [MAR Hold] aspirin EC  81 mg Oral Daily  . [MAR Hold] isosorbide mononitrate  30 mg Oral Daily  . [MAR Hold] mouth rinse  15 mL Mouth Rinse BID  . [MAR Hold] metoprolol tartrate  25 mg Oral BID  . [MAR Hold] regadenoson  0.4 mg Intravenous Once  . [MAR  Hold] sodium chloride flush  3 mL Intravenous Q12H   . sodium chloride 100 mL/hr at 07/12/17 0337  . [MAR Hold] sodium chloride    . [MAR Hold] famotidine (PEPCID) IV 20 mg (07/12/17 0847)  . lactated ringers    . [MAR Hold] piperacillin-tazobactam (ZOSYN)  IV 3.375 g (07/12/17 0844)   Assessment/Plan Gallstone pancreatitis - lipase 5890 =>> 36 Elevated LFT - Improving bilirubin 4.4 =>> 0.5 E coli UTI  - >100K CAD - positive stress test, cath with lesion that will be intervened once recovered from surgery COPD/emphysema - class 2 dyspnea/Hx of caustic lung injury - ongoing tobacco use Hx of GERD/dysphagia - EGD 8/11 FEN: IV fluids/NPO ID:  Ceftriaxone 1gm 07/07/17;  Zosyn 07/07/17-07/10/17  DVT:  SCD only  Plan: OR today for laparoscopic cholecystectomy. I have discussed with him the surgery, the risks including bleeding, pain, scarring, intraabdominal injury specifically to the common bile duct and sequelae, conversion to open surgery, blood clot, heart attack, stroke, pneumonia, prolonged hospitalization. He expressed understanding and agrees to proceed.      LOS: 4 days    John Forbes 07/12/2017

## 2017-07-12 NOTE — Transfer of Care (Signed)
Immediate Anesthesia Transfer of Care Note  Patient: John Forbes  Procedure(s) Performed: Procedure(s): LAPAROSCOPIC CHOLECYSTECTOMY (N/A)  Patient Location: PACU  Anesthesia Type:General  Level of Consciousness: awake, alert  and oriented  Airway & Oxygen Therapy: Patient Spontanous Breathing and Patient connected to face mask oxygen  Post-op Assessment: Report given to RN and Post -op Vital signs reviewed and stable  Post vital signs: Reviewed and stable  Last Vitals:  Vitals:   07/12/17 0400 07/12/17 0844  BP:  119/68  Pulse:    Resp:    Temp: 36.7 C     Last Pain:  Vitals:   07/12/17 0400  TempSrc: Oral  PainSc:          Complications: No apparent anesthesia complications

## 2017-07-12 NOTE — Discharge Instructions (Signed)
[facility] ______[facility] °LAPAROSCOPIC SURGERY: POST OP INSTRUCTIONS °Always review your discharge instruction sheet given to you by the facility where your surgery was performed. °IF YOU HAVE DISABILITY OR FAMILY LEAVE FORMS, YOU MUST BRING THEM TO THE OFFICE FOR PROCESSING.   °DO NOT GIVE THEM TO YOUR DOCTOR. ° °1. A prescription for pain medication may be given to you upon discharge.  Take your pain medication as prescribed, if needed.  If narcotic pain medicine is not needed, then you may take acetaminophen (Tylenol) or ibuprofen (Advil) as needed. °2. Take your usually prescribed medications unless otherwise directed. °3. If you need a refill on your pain medication, please contact your pharmacy.  They will contact our office to request authorization. Prescriptions will not be filled after 5pm or on week-ends. °4. You should follow a light diet the first few days after arrival home, such as soup and crackers, etc.  Be sure to include lots of fluids daily. °5. Most patients will experience some swelling and bruising in the area of the incisions.  Ice packs will help.  Swelling and bruising can take several days to resolve.  °6. It is common to experience some constipation if taking pain medication after surgery.  Increasing fluid intake and taking a stool softener (such as Colace) will usually help or prevent this problem from occurring.  A mild laxative (Milk of Magnesia or Miralax) should be taken according to package instructions if there are no bowel movements after 48 hours. °7. Unless discharge instructions indicate otherwise, you may remove your bandages 24-48 hours after surgery, and you may shower at that time.  You may have steri-strips (small skin tapes) in place directly over the incision.  These strips should be left on the skin for 7-10 days.  If your surgeon used skin glue on the incision, you may shower in 24 hours.  The glue will flake off over the next 2-3 weeks.  Any sutures or  staples will be removed at the office during your follow-up visit. °8. ACTIVITIES:  You may resume regular (light) daily activities beginning the next day--such as daily self-care, walking, climbing stairs--gradually increasing activities as tolerated.  You may have sexual intercourse when it is comfortable.  Refrain from any heavy lifting or straining until approved by your doctor. °a. You may drive when you are no longer taking prescription pain medication, you can comfortably wear a seatbelt, and you can safely maneuver your car and apply brakes. °b. RETURN TO WORK:  __________________________________________________________ °9. You should see your doctor in the office for a follow-up appointment approximately 2-3 weeks after your surgery.  Make sure that you call for this appointment within a day or two after you arrive home to insure a convenient appointment time. °10. OTHER INSTRUCTIONS: __________________________________________________________________________________________________________________________ __________________________________________________________________________________________________________________________ °WHEN TO CALL YOUR DOCTOR: °1. Fever over 101.0 °2. Inability to urinate °3. Continued bleeding from incision. °4. Increased pain, redness, or drainage from the incision. °5. Increasing abdominal pain ° °The clinic staff is available to answer your questions during regular business hours.  Please don’t hesitate to call and ask to speak to one of the nurses for clinical concerns.  If you have a medical emergency, go to the nearest emergency room or call 911.  A surgeon from [facility] is always on call at the hospital. °[address], [address], [place], [place]  [address] ? [address] [address], [place],    [address] °[phone] ? [phone] ? FAX [phone] °Web site:   [email or web address] ° ° °Laparoscopic Cholecystectomy, Care After °This sheet  gives you information about how to care for yourself after your procedure. Your health care provider may also give you more specific instructions. If you have problems or questions, contact your health care provider. °What can I expect after the procedure? °After the procedure, it is common to have: °· Pain at your incision sites. You will be given medicines to control this pain. °· Mild nausea or vomiting. °· Bloating and possible shoulder pain from the air-like gas that was used during the procedure. ° °Follow these instructions at home: °Incision care ° °· Follow instructions from your health care provider about how to take care of your incisions. Make sure you: °? Wash your hands with soap and water before you change your bandage (dressing). If soap and water are not available, use hand sanitizer. °? Change your dressing as told by your health care provider. °? Leave stitches (sutures), skin glue, or adhesive strips in place. These skin closures may need to be in place for 2 weeks or longer. If adhesive strip edges start to loosen and curl up, you may trim the loose edges. Do not remove adhesive strips completely unless your health care provider tells you to do that. °· Do not take baths, swim, or use a hot tub until your health care provider approves. Ask your health care provider if you can take showers. You may only be allowed to take sponge baths for bathing. °· Check your incision area every day for signs of infection. Check for: °? More redness, swelling, or pain. °? More fluid or blood. °? Warmth. °? Pus or a bad smell. °Activity °· Do not drive or use heavy machinery while taking prescription pain medicine. °· Do not lift anything that is heavier than 10 lb (4.5 kg) until your health care provider approves. °· Do not play contact sports until your health care provider approves. °· Do not drive for 24 hours if you were given a medicine to help you relax (sedative). °· Rest as needed. Do not return to work  or school until your health care provider approves. °General instructions °· Take over-the-counter and prescription medicines only as told by your health care provider. °· To prevent or treat constipation while you are taking prescription pain medicine, your health care provider may recommend that you: °? Drink enough fluid to keep your urine clear or pale yellow. °? Take over-the-counter or prescription medicines. °? Eat foods that are high in fiber, such as fresh fruits and vegetables, whole grains, and beans. °? Limit foods that are high in fat and processed sugars, such as fried and sweet foods. °Contact a health care provider if: °· You develop a rash. °· You have more redness, swelling, or pain around your incisions. °· You have more fluid or blood coming from your incisions. °· Your incisions feel warm to the touch. °· You have pus or a bad smell coming from your incisions. °· You have a fever. °· One or more of your incisions breaks open. °Get help right away if: °· You have trouble breathing. °· You have chest pain. °· You have increasing pain in your shoulders. °· You faint or feel dizzy when you stand. °· You have severe pain in your abdomen. °· You have nausea or vomiting that lasts for more than one day. °· You have leg pain. °This information is not intended to replace advice given to you by your health care provider. Make sure you   discuss any questions you have with your health care provider. °Document Released: 12/04/2005 Document Revised: 06/24/2016 Document Reviewed: 05/22/2016 °Elsevier Interactive Patient Education © 2017 [employer] ° °

## 2017-07-12 NOTE — Progress Notes (Signed)
Patient returned from surgery, alert and oriented, mild pain will medicate per orders,4 laps sites with skin glue noted to abdomen, IV fluids infusing,  VSS, on 2L o2 wants it to be removed I told him we would wean down and remove through the day. Will continue to monitor. Family present in room.

## 2017-07-12 NOTE — Progress Notes (Signed)
[facility] Gastroenterology Progress Note  John Forbes 56 y.o. 02-Jan-1961  CC:  Acute pancreatitis   Subjective: Patient is status post laparoscopy cholecystectomy today. Complaining of mild pain at incisional sites.  ROS : Negative for active chest pain and shortness of breath.   Objective: Vital signs in last 24 hours: Vitals:   07/12/17 1303 07/12/17 1323  BP: (!) 115/59 118/68  Pulse: (!) 44 (!) 54  Resp: (!) 21 20  Temp:  (!) 97.5 F (36.4 C)    Physical Exam:  General:  Alert, cooperative, no distress, appears stated age  Head:  Normocephalic, without obvious abnormality, atraumatic  Eyes:  , EOM's intact,   Lungs:   Clear to auscultation bilaterally, respirations unlabored  Heart:  Regular rate and rhythm, S1, S2 normal  Abdomen:   Soft, pain around incisional sites, bowel sounds active all four quadrants,  no masses, no peritoneal signs   Extremities: Extremities normal, atraumatic, no  edema  Pulses: 2+ and symmetric    Lab Results:  Recent Labs  07/10/17 0607 07/12/17 0439  NA 142 143  K 3.8 3.7  CL 112* 114*  CO2 23 24  GLUCOSE 83 118*  BUN 8 6  CREATININE 0.86 0.81  CALCIUM 8.3* 8.2*  MG 1.8  --     Recent Labs  07/10/17 0607 07/12/17 0439  AST 30 17  ALT 196* 108*  ALKPHOS 106 80  BILITOT 1.4* 0.5  PROT 5.8* 5.7*  ALBUMIN 2.9* 2.8*    Recent Labs  07/10/17 0607 07/12/17 0439  WBC 8.3 6.7  HGB 12.0* 11.6*  HCT 36.0* 35.0*  MCV 90.2 90.4  PLT 128* 173   No results for input(s): LABPROT, INR in the last 72 hours.    Assessment/Plan: - Acute pancreatitis. Most likely from gallstone. Not able to get MRCP done. - Elevated LFTs. Could be from passed  CBD stone.LFTs improving - Abnormal nuclear stress test. - Due for surveillance colonoscopy  Recommendations ----------------------- - Patient is status post laparoscopic cholecystectomy today. He was found to have 1 cm from stone impacted in the cystic duct which was  subsequently removed. Intraoperative cholangiogram was not performed. -  LFTs are  Improving/ Normal now. No plan for further inpatient GI workup -  follow-up in GI clinic in 3-4 weeks after discharge. Patient is also due for his colonoscopy - GI will sign off. Call us back if needed   Otis Brace MD, [place] 07/12/2017, 2:07 PM  Pager [phone]  If no answer or after 5 PM call [phone]

## 2017-07-12 NOTE — Anesthesia Procedure Notes (Signed)
Procedure Name: Intubation Date/Time: 07/12/2017 11:02 AM Performed by: Glory Buff Pre-anesthesia Checklist: Patient identified, Emergency Drugs available, Suction available and Patient being monitored Patient Re-evaluated:Patient Re-evaluated prior to induction Oxygen Delivery Method: Circle system utilized Preoxygenation: Pre-oxygenation with 100% oxygen Induction Type: IV induction Ventilation: Mask ventilation without difficulty Laryngoscope Size: Miller and 3 Grade View: Grade I Tube type: Oral Tube size: 7.5 mm Number of attempts: 1 Airway Equipment and Method: Stylet and Oral airway Placement Confirmation: ETT inserted through vocal cords under direct vision,  positive ETCO2 and breath sounds checked- equal and bilateral Secured at: 21 cm Tube secured with: Tape Dental Injury: Teeth and Oropharynx as per pre-operative assessment

## 2017-07-12 NOTE — Op Note (Signed)
Operative Note  John Forbes 56 y.o. male [id number]  07/12/2017  Surgeon: Clovis Riley   Assistant: OR staff  Procedure performed: Laparoscopic Cholecystectomy  Preop diagnosis: gallstone pancreatitis Post-op diagnosis/intraop findings: same, chronic cholecystitis  Specimens: gallbladder  EBL: 93YB  Complications: none  Description of procedure: After obtaining informed consent the patient was brought to the operating room. Prophylactic antibiotics and subcutaneous heparin were administered. SCD's were applied. General endotracheal anesthesia was initiated and a formal time-out was performed. The abdomen was prepped and draped in the usual sterile fashion and the abdomen was entered using an infraumbilical veress needle after instilling the site with local. Insufflation to 4mmHg was obtained, 29mm trocar and camera placed and gross inspection revealed no evidence of injury from our entry or other intraabdominal abnormalities, though he does have omental adhesions to the left abdominal wall. Two 63mm trocars were introduced in the right midclavicular and right anterior axillary lines under direct visualization and following infiltration with local. An 77mm trocar was placed in the epigastrium. The gallbladder was retracted cephalad and the infundibulum was retracted laterally. A combination of hook electrocautery and blunt dissection was utilized to clear the peritoneum from the neck and cystic duct, circumferentially isolating the cystic artery and cystic duct and lifting the gallbladder from the cystic plate. The critical view of safety was achieved with the cystic artery, cystic duct, and liver bed visualized between them with no other structures. The common bile duct was also visible and carefully avoided. There was a 1cm firm stone impacted within the short cystic duct. The artery was clipped with a single clip proximally and distally and divided as was the cystic duct with two clips  on the proximal end, proximal to the impacted stone. The cystic duct stump was patulous and I reinforced the clips by placing a PDS endoloop snugly just proximal to the clips to ensure good seal on the duct. The gallbladder was dissected from the liver plate using electrocautery. Once freed the gallbladder was placed in an endocatch bag and removed intact through the epigastric trocar site. A small amount of bleeding on the liver bed was controlled with cautery. The stone which had been in the cystic duct had crumbled and fallen out when the duct was divided and this was removed entirely. The right upper quadrant was irrigated copiously and the effluent was clear. Hemostasis was once again confirmed, and reinspection of the abdomen revealed no injuries. The clips and endoloop were well opposed without any bile leak from the duct or the liver bed. The 49mm trocar site in the epigastrium was closed with a 0 vicryl in the fascia under direct visualization using a PMI device. The abdomen was desufflated and all trocars removed. The skin incisions were closed with running subcuticular monocryl and Dermabond. The patient was awakened, extubated and transported to the recovery room in stable condition.   All counts were correct at the completion of the case.

## 2017-07-12 NOTE — Anesthesia Preprocedure Evaluation (Addendum)
Anesthesia Evaluation  Patient identified by MRN, date of birth, ID band Patient awake    Reviewed: Allergy & Precautions, NPO status , Patient's Chart, lab work & pertinent test results  Airway Mallampati: I       Dental  (+) Edentulous Upper, Edentulous Lower   Pulmonary Current Smoker,    Pulmonary exam normal breath sounds clear to auscultation       Cardiovascular Normal cardiovascular exam Rhythm:Regular Rate:Normal     Neuro/Psych    GI/Hepatic Neg liver ROS,   Endo/Other  negative endocrine ROS  Renal/GU negative Renal ROS     Musculoskeletal negative musculoskeletal ROS (+)   Abdominal Normal abdominal exam  (+)   Peds  Hematology negative hematology ROS (+)   Anesthesia Other Findings John Forbes  Cardiac catheterization  Order# [id number]  Reading physician: John Mocha, MD Ordering physician: John Mocha, MD Study date: 07/11/17 Physicians   Panel Physicians Referring Physician Case Authorizing Physician John Mocha, MD (Primary)   Procedures   Left Heart Cath and Coronary Angiography Conclusion   1. Severe single-vessel coronary artery disease involving the mid right coronary artery 2. Continued patency of the stented segment in the right PDA with moderate focal proximal edge restenosis 3. Mild to moderate left circumflex/first OM stenosis 4. Mild nonobstructive LAD stenosis with severe stenosis of Forbes tiny diagonal branch  Recommendations: Discussed case and reviewed films with John Forbes. The patient does not have symptoms of unstable angina and he has noncritical coronary artery disease. His films are compared to his previous study from 2014. There is progressive stenosis in the mid right coronary artery and PCI would be appropriate. However, the patient has gallstone pancreatitis and will require laparoscopic cholecystectomy. It seems most prudent to proceed with his surgery on  medical therapy for coronary artery disease as planned tomorrow. His PCI can be done electively after he recovers from surgery. PCI prior to surgery would likely result in surgical delay and potential complications related to his gallstone pancreatitis. He is on good medical therapy with metoprolol and isosorbide and appears to be well beta blocked at present. Case also discussed with the surgical team, John Forbes. Indications                               *John Forbes*                         John Forbes John Forbes                            [phone]  ------------------------------------------------------------------- Transthoracic Echocardiography  Patient:    John Forbes MR #:       [id number] Study Date: 07/09/2017 Gender:     Forbes Age:        56 Height:     182.9 cm Weight:     92.9 kg BSA:        2.19 Forbes^2 Pt. Status: Room:       John Forbes    John Forbes  John Forbes, [place]  REFERRING    John Forbes  SONOGRAPHER  John Forbes  PERFORMING   [facility], Inpatient  ADMITTING    John Forbes  cc:  ------------------------------------------------------------------- LV EF: 55% -   60%  ------------------------------------------------------------------- Indications:      V728.1 Pre-op evaluation.  ------------------------------------------------------------------- History:   PMH:  Planned GI surgery. CAD S/P stenting. Exertional chest pain. Smoker. Former alcoholic. Former cocaine use.  ------------------------------------------------------------------- Study Conclusions  - Left ventricle: The cavity size was normal. Wall thickness was   increased in Forbes pattern of mild LVH. Systolic function was normal.   The estimated ejection fraction was in the range of 55% to 60%.   Wall motion was normal; there were no regional wall  motion   Reproductive/Obstetrics                            Anesthesia Physical Anesthesia Plan  ASA: III  Anesthesia Plan: General   Post-op Pain Management:    Induction: Intravenous  PONV Risk Score and Plan: 3 and Ondansetron, Dexamethasone, Propofol and Midazolam  Airway Management Planned:   Additional Equipment:   Intra-op Plan:   Post-operative Plan:   Informed Consent: I have reviewed the patients History and Physical, chart, labs and discussed the procedure including the risks, benefits and alternatives for the proposed anesthesia with the patient or authorized representative who has indicated his/her understanding and acceptance.   Dental advisory given  Plan Discussed with: CRNA and Surgeon  Anesthesia Plan Comments:         Anesthesia Quick Evaluation

## 2017-07-12 NOTE — Progress Notes (Signed)
[facility] PROGRESS NOTE  John Forbes HER:[id number] DOB: 08/02/1961 DOA: 07/07/2017  PCP: Patient, [name]  Brief History/Interval Summary: 56 year old Caucasian male with a past medical history of coronary artery disease with stent placement, history of in-stent restenosis, history of rheumatic fever, COPD, hiatal hernia, presented with abdominal pain. Found to have biliary pancreatitis. Patient was hospitalized for further management.  Reason for Visit: Acute pancreatitis. Possible acute cholecystitis.  Consultants: Gastroenterology. Surgery. Cardiology  Procedures:  Transthoracic echocardiogram Study Conclusions - Left ventricle: The cavity size was normal. Wall thickness was   increased in a pattern of mild LVH. Systolic function was normal.   The estimated ejection fraction was in the range of 55% to 60%.   Wall motion was normal; there were no regional wall motion   abnormalities. Doppler parameters are consistent with abnormal   left ventricular relaxation (grade 1 diastolic dysfunction). The   E/e&' ratio is between 8-15, suggesting indeterminate LV filling   pressure. - Mitral valve: Mildly thickened leaflets . There was trivial   regurgitation. - Left atrium: The atrium was normal in size. - Right atrium: The atrium was mildly dilated. - Inferior vena cava: The vessel was normal in size. The   respirophasic diameter changes were in the normal range (>= 50%),   consistent with normal central venous pressure. Impressions: - Compared to a prior study in 2011, the LVEF is unchanged.  Nuclear Stress test Study Result    There was no ST segment deviation noted during stress.  There is a small defect present in the basal anteroseptal location. The defect is non-reversible.  There is a medium defect of moderate severity present in the basal inferior, mid inferior and apical inferior location. The defect is partially reversible and is concerning for  inferior infarct with peri infarct ischemia but could also be due to variations in diaphragmatic attenuation.  This is an intermediate risk study.  Nuclear stress EF: 53%.  The left ventricular ejection fraction is mildly decreased (45-54%).   Cardiac catheterization Conclusion   1. Severe single-vessel coronary artery disease involving the mid right coronary artery 2. Continued patency of the stented segment in the right PDA with moderate focal proximal edge restenosis 3. Mild to moderate left circumflex/first OM stenosis 4. Mild nonobstructive LAD stenosis with severe stenosis of a tiny diagonal branch      Antibiotics: None  Subjective/Interval History: Patient feels well. Denies any chest pain or shortness of breath. Continues to have some discomfort in the abdomen, but overall improved. Denies any nausea or vomiting.   ROS: Denies any headaches.  Objective:  Vital Signs  Vitals:   07/11/17 1515 07/11/17 1543 07/11/17 2124 07/12/17 0400  BP: 112/66 125/78 140/73   Pulse: (!) 43 (!) 57 65   Resp: 19 19 18    Temp:  98.2 F (36.8 C) 98.2 F (36.8 C) 98.1 F (36.7 C)  TempSrc:  Oral Oral Oral  SpO2: 97% 98% 98%   Weight:      Height:        Intake/Output Summary (Last 24 hours) at 07/12/17 0757 Last data filed at 07/12/17 0423  Gross per 24 hour  Intake              840 ml  Output              400 ml  Net              440 ml   [employer]  07/07/17 2155 07/08/17 0222  Weight: 95.3 kg (210 lb) 92.9 kg (204 lb 12.9 oz)    General appearance: Awake, alert. In no distress. Resp: Clear to auscultation bilaterally. No wheezing, rales or rhonchi. Cardio: S1, S2 is normal, regular. No S3, S4. No rubs, murmurs, or bruit. No pedal edema. GI: Abdomen is benign. Soft. Nontender today. No masses or organomegaly. Bowel sounds are present. Extremities: No edema Neurologic: No focal neurological deficit  Lab Results:  Data Reviewed: I have personally reviewed  following labs and imaging studies  CBC:  Recent Labs Lab 07/07/17 1509 07/07/17 2156 07/08/17 0534 07/10/17 0607 07/12/17 0439  WBC 6.4 12.8* 11.8* 8.3 6.7  HGB 14.7 15.5 15.3 12.0* 11.6*  HCT 44.4 46.3 44.2 36.0* 35.0*  MCV 91.7 91.1 90.8 90.2 90.4  PLT 178 178 169 128* 161    Basic Metabolic Panel:  Recent Labs Lab 07/07/17 2156 07/08/17 0534 07/09/17 0616 07/10/17 0607 07/12/17 0439  NA 137 139 138 142 143  K 3.9 4.6 3.9 3.8 3.7  CL 105 107 108 112* 114*  CO2 21* 24 23 23 24   GLUCOSE 121* 134* 99 83 118*  BUN 10 11 11 8 6   CREATININE 1.02 0.91 0.92 0.86 0.81  CALCIUM 9.0 8.8* 8.4* 8.3* 8.2*  MG  --   --   --  1.8  --     GFR: Estimated Creatinine Clearance: 111.8 mL/min (by C-G formula based on SCr of 0.81 mg/dL).  Liver Function Tests:  Recent Labs Lab 07/07/17 2156 07/08/17 0534 07/09/17 0616 07/10/17 0607 07/12/17 0439  AST 292* 185* 58* 30 17  ALT 685* 591* 319* 196* 108*  ALKPHOS 180* 186* 141* 106 80  BILITOT 4.4* 1.6* 1.5* 1.4* 0.5  PROT 7.4 7.4 6.0* 5.8* 5.7*  ALBUMIN 4.0 3.8 3.3* 2.9* 2.8*     Recent Labs Lab 07/07/17 1509 07/07/17 2156 07/08/17 1034 07/09/17 0616 07/10/17 0607  LIPASE 27 5,890* 870* 90* 36    Coagulation Profile:  Recent Labs Lab 07/08/17 1034  INR 0.97     Recent Results (from the past 240 hour(s))  Urine culture     Status: Abnormal   Collection Time: 07/07/17  4:43 PM  Result Value Ref Range Status   Specimen Description URINE, RANDOM  Final   Special Requests NONE  Final   Culture (A)  Final    >=100,000 COLONIES/mL ESCHERICHIA COLI Confirmed Extended Spectrum Beta-Lactamase Producer (ESBL)    Report Status 07/09/2017 FINAL  Final   Organism ID, Bacteria ESCHERICHIA COLI (A)  Final      Susceptibility   Escherichia coli - MIC*    AMPICILLIN >=32 RESISTANT Resistant     CEFAZOLIN >=64 RESISTANT Resistant     CEFTRIAXONE >=64 RESISTANT Resistant     CIPROFLOXACIN <=0.25 SENSITIVE Sensitive      GENTAMICIN <=1 SENSITIVE Sensitive     IMIPENEM <=0.25 SENSITIVE Sensitive     NITROFURANTOIN <=16 SENSITIVE Sensitive     TRIMETH/SULFA <=20 SENSITIVE Sensitive     AMPICILLIN/SULBACTAM 16 INTERMEDIATE Intermediate     PIP/TAZO <=4 SENSITIVE Sensitive     Extended ESBL POSITIVE Resistant     * >=100,000 COLONIES/mL ESCHERICHIA COLI  Surgical pcr screen     Status: None   Collection Time: 07/09/17 11:40 PM  Result Value Ref Range Status   MRSA, PCR NEGATIVE NEGATIVE Final   Staphylococcus aureus NEGATIVE NEGATIVE Final    Comment:        The Xpert SA Assay (FDA approved  for NASAL specimens in patients over 8 years of age), is one component of a comprehensive surveillance program.  Test performance has been validated by [facility] for patients greater than or equal to 26 year old. It is not intended to diagnose infection nor to guide or monitor treatment.       Radiology Studies: Nm Myocar Multi W/spect W/wall Motion / Ef  Result Date: 07/10/2017  There was no ST segment deviation noted during stress.  There is a small defect present in the basal anteroseptal location. The defect is non-reversible.  There is a medium defect of moderate severity present in the basal inferior, mid inferior and apical inferior location. The defect is partially reversible and is concerning for inferior infarct with peri infarct ischemia but could also be due to variations in diaphragmatic attenuation.  This is an intermediate risk study.  Nuclear stress EF: 53%.  The left ventricular ejection fraction is mildly decreased (45-54%).      Medications:  Scheduled: . aspirin EC  81 mg Oral Daily  . isosorbide mononitrate  30 mg Oral Daily  . mouth rinse  15 mL Mouth Rinse BID  . metoprolol tartrate  25 mg Oral BID  . regadenoson  0.4 mg Intravenous Once  . sodium chloride flush  3 mL Intravenous Q12H   Continuous: . sodium chloride 100 mL/hr at 07/12/17 0337  . sodium chloride    .  famotidine (PEPCID) IV Stopped (07/11/17 2353)  . piperacillin-tazobactam (ZOSYN)  IV 3.375 g (07/12/17 0412)   ZOX:WRUEAV chloride, acetaminophen, ipratropium-albuterol, morphine injection, ondansetron **OR** ondansetron (ZOFRAN) IV, sodium chloride flush  Assessment/Plan:  Principal Problem:   Gallstone pancreatitis Active Problems:   TOBACCO ABUSE   COPD (chronic obstructive pulmonary disease) (HCC)   Coronary artery disease involving native coronary artery of native heart with unstable angina pectoris (HCC)   Acute cholecystitis   Abdominal pain   LFTs abnormal   Chest pain with moderate risk for cardiac etiology   Preop cardiovascular exam   Abnormal nuclear stress test    Acute biliary pancreatitis/concern for cholangitis/cholecystitis Patient seen by gastroenterology and general surgery. LFTs are improved. Patient continues to have some symptoms. Patient seen by cardiology to provide preoperative evaluation. Considering significant history of CAD. Patient underwent nuclear stress test which was abnormal. This was followed by cardiac catheterization. He is found to have coronary artery disease. However, recommendation at this time is to proceed with the cholecystectomy and to address the coronary artery disease at a later date. Continue Zosyn.  History of coronary artery disease Patient with history of stent in the past and also history of in-stent restenosis in the past. Positive stress test results noted. See report of cardiac catheterization above. He is found to have disease involving the RCA. Plan is for intervention at a later date. Cardiology has cleared the patient to undergo surgery for now. He remains stable from a cardiac standpoint. Continue his aspirin, beta blocker, nitrates. Statin could be initiated once LFTs are completely normal.   History of COPD Stable.  History of tobacco abuse. Counseled. Patient declined nicotine patch.  Abnormal UA Urine culture  positive for Escherichia coli. Not thought to be a true infection.   Chronic low back pain Stable   DVT Prophylaxis: SCDs    Code Status: Full code  Family Communication: Discussed with the patient  Disposition Plan: Management as outlined above. Surgery today.    LOS: 4 days   [facility] Pager [phone] 07/12/2017, 7:57  AM  If 7PM-7AM, please contact night-coverage at [email or web address], password [facility]

## 2017-07-12 NOTE — Care Management Important Message (Signed)
Important Message  Patient Details  Name: John Forbes MRN: [id number] Date of Birth: [birth year]   Medicare Important Message Given:  Yes    Sotero Brinkmeyer 07/12/2017, 11:43 AM

## 2017-07-12 NOTE — Progress Notes (Signed)
Patient transported to OR, pre-op checklist complete, report called to short stay, family at bedside.

## 2017-07-12 NOTE — Progress Notes (Signed)
Progress Note  Patient Name: [name] Date of Encounter: 07/12/2017  Primary Cardiologist: Dr Radford Pax  Subjective   Sedated post op, denies chest pain  Inpatient Medications    Scheduled Meds: . aspirin EC  81 mg Oral Daily  . docusate sodium  100 mg Oral BID  . isosorbide mononitrate  30 mg Oral Daily  . mouth rinse  15 mL Mouth Rinse BID  . metoprolol tartrate  25 mg Oral BID  . regadenoson  0.4 mg Intravenous Once  . sodium chloride flush  3 mL Intravenous Q12H   Continuous Infusions: . sodium chloride 100 mL/hr at 07/12/17 0337  . sodium chloride    . famotidine (PEPCID) IV Stopped (07/12/17 5188)  . lactated ringers    . piperacillin-tazobactam (ZOSYN)  IV Stopped (07/12/17 1244)   PRN Meds: sodium chloride, acetaminophen, ipratropium-albuterol, morphine injection, ondansetron **OR** ondansetron (ZOFRAN) IV, oxyCODONE, sodium chloride flush   Vital Signs    Vitals:   07/12/17 1302 07/12/17 1303 07/12/17 1323 07/12/17 1532  BP:  (!) 115/59 118/68   Pulse:  (!) 44 (!) 54   Resp:  (!) 21 20   Temp: 97.8 F (36.6 C)  (!) 97.5 F (36.4 C)   TempSrc:   Oral   SpO2:  95% 95% 92%  Weight:      Height:        Intake/Output Summary (Last 24 hours) at 07/12/17 1550 Last data filed at 07/12/17 1532  Gross per 24 hour  Intake             2073 ml  Output              510 ml  Net             1563 ml   Filed Weights   07/07/17 2155 07/08/17 0222 07/12/17 0927  Weight: 210 lb (95.3 kg) 204 lb 12.9 oz (92.9 kg) 204 lb 12.9 oz (92.9 kg)    Telemetry    NSR - Personally Reviewed  ECG    Post Op EKG pendning-   Physical Exam   GEN: No acute distress.   Neck: No JVD Cardiac: RRR, no murmurs, rubs, or gallops.  Respiratory: Clear to auscultation bilaterally. GI: tender, not distended MS: No edema; No deformity. Neuro:  Nonfocal  Psych: Normal affect   Labs    Chemistry Recent Labs Lab 07/09/17 0616 07/10/17 0607 07/12/17 0439  NA 138  142 143  K 3.9 3.8 3.7  CL 108 112* 114*  CO2 23 23 24   GLUCOSE 99 83 118*  BUN 11 8 6   CREATININE 0.92 0.86 0.81  CALCIUM 8.4* 8.3* 8.2*  PROT 6.0* 5.8* 5.7*  ALBUMIN 3.3* 2.9* 2.8*  AST 58* 30 17  ALT 319* 196* 108*  ALKPHOS 141* 106 80  BILITOT 1.5* 1.4* 0.5  GFRNONAA >60 >60 >60  GFRAA >60 >60 >60  ANIONGAP 7 7 5      Hematology Recent Labs Lab 07/08/17 0534 07/10/17 0607 07/12/17 0439  WBC 11.8* 8.3 6.7  RBC 4.87 3.99* 3.87*  HGB 15.3 12.0* 11.6*  HCT 44.2 36.0* 35.0*  MCV 90.8 90.2 90.4  MCH 31.4 30.1 30.0  MCHC 34.6 33.3 33.1  RDW 15.3 14.9 15.3  PLT 169 128* 173    Cardiac EnzymesNo results for input(s): TROPONINI in the last 168 hours. No results for input(s): TROPIPOC in the last 168 hours.   BNPNo results for input(s): BNP, PROBNP in the last 168 hours.  DDimer No results for input(s): DDIMER in the last 168 hours.   Radiology    No results found.  Cardiac Studies   Cath 07/11/17  Patient Profile     56 y.o. male with a h/o CAD, HL, tob abuse, and GERD who was admitted 7/21 2/2 gallstone pancreatitis and cholecystitis.  Assessment & Plan    1.  Gallstone pancreatitis/cholecystitis: S/P laproscopic cholecystectomy today  2. CAD:  see cath report. Patent (50% ISR) of distal RCA stent, 75% mRCA-medical Rx for now  3.  Tob Abuse:  Cessation advised.  4.  HL:  LDL 110.  Will ultimately need addition of statin but wait until LFTs are normal for some time prior to initiating.   Plan: Tolerated surgery- resume PO medications when able.   Signed, Kerin Ransom, PA-C  07/12/2017, 3:50 PM    Patient seen and examined. Agree with assessment and plan.The patient underwent successful cholecystectomy today and tolerated this well from a cardiovascular standpoint.  Postop ECG is pending.  I reviewed the angiographic findings from the cardiac catheterization done yesterday and discussed this yesterday in the lab with Dr. Burt Knack.  The mid RCA lesion .   75-80% and somewhat ulcerated.  At that time made the decision for him to undergo his cholecystectomy today.  We will continue with initial medical management with plans for PCI to the RCA in several weeks following stabilization.  Ultimately, when LFTs normalize, he will need to be in started on statin therapy with his CAD with target LDL less than 70.  At present, continue isosorbide and metoprolol for anti-ischemic therapy.   Troy Sine, MD, [facility] 07/12/2017 4:52 PM

## 2017-07-13 ENCOUNTER — Encounter (HOSPITAL_COMMUNITY): Payer: Self-pay | Admitting: Surgery

## 2017-07-13 LAB — COMPREHENSIVE METABOLIC PANEL
ALK PHOS: 78 U/L (ref 38–126)
ALT: 90 U/L — AB (ref 17–63)
AST: 20 U/L (ref 15–41)
Albumin: 3 g/dL — ABNORMAL LOW (ref 3.5–5.0)
Anion gap: 5 (ref 5–15)
BUN: 6 mg/dL (ref 6–20)
CALCIUM: 8.7 mg/dL — AB (ref 8.9–10.3)
CO2: 25 mmol/L (ref 22–32)
CREATININE: 0.92 mg/dL (ref 0.61–1.24)
Chloride: 110 mmol/L (ref 101–111)
Glucose, Bld: 205 mg/dL — ABNORMAL HIGH (ref 65–99)
Potassium: 4.2 mmol/L (ref 3.5–5.1)
SODIUM: 140 mmol/L (ref 135–145)
TOTAL PROTEIN: 5.9 g/dL — AB (ref 6.5–8.1)
Total Bilirubin: 0.8 mg/dL (ref 0.3–1.2)

## 2017-07-13 LAB — CBC
HCT: 34.3 % — ABNORMAL LOW (ref 39.0–52.0)
Hemoglobin: 11.5 g/dL — ABNORMAL LOW (ref 13.0–17.0)
MCH: 30.3 pg (ref 26.0–34.0)
MCHC: 33.5 g/dL (ref 30.0–36.0)
MCV: 90.5 fL (ref 78.0–100.0)
PLATELETS: 165 10*3/uL (ref 150–400)
RBC: 3.79 MIL/uL — AB (ref 4.22–5.81)
RDW: 15 % (ref 11.5–15.5)
WBC: 7.9 10*3/uL (ref 4.0–10.5)

## 2017-07-13 MED ORDER — OXYCODONE HCL 5 MG PO TABS: 5.0000 mg | ORAL_TABLET | Freq: Four times a day (QID) | ORAL | 0 refills | Status: DC | PRN

## 2017-07-13 MED ORDER — DOCUSATE SODIUM 100 MG PO CAPS: 100.0000 mg | ORAL_CAPSULE | Freq: Two times a day (BID) | ORAL | 0 refills | Status: DC

## 2017-07-13 MED ORDER — METOPROLOL TARTRATE 25 MG PO TABS: 25.0000 mg | ORAL_TABLET | Freq: Two times a day (BID) | ORAL | 2 refills | Status: DC

## 2017-07-13 MED ORDER — ISOSORBIDE MONONITRATE ER 30 MG PO TB24: 30.0000 mg | ORAL_TABLET | Freq: Every day | ORAL | 2 refills | Status: DC

## 2017-07-13 NOTE — Progress Notes (Signed)
Discharge home. Home discharge instruction given. 

## 2017-07-13 NOTE — Discharge Summary (Signed)
[facility]  Physician Discharge Summary   Patient ID: John Forbes MRN: [id number] DOB/AGE: September 14, 1961 56 y.o.  Admit date: 07/07/2017 Discharge date: 07/13/2017  DISCHARGE DIAGNOSES:  Principal Problem:   Gallstone pancreatitis Active Problems:   TOBACCO ABUSE   COPD (chronic obstructive pulmonary disease) (HCC)   Coronary artery disease involving native coronary artery of native heart with unstable angina pectoris (HCC)   Acute cholecystitis   Abdominal pain   LFTs abnormal   Chest pain with moderate risk for cardiac etiology   Preop cardiovascular exam   Abnormal nuclear stress test   RECOMMENDATIONS FOR OUTPATIENT FOLLOW UP: 1. Cardiology to arrange outpatient follow-up for further management of his RCA disease  DISCHARGE CONDITION: fair  Diet recommendation: Heart healthy  Filed Weights   07/07/17 2155 07/08/17 0222 07/12/17 0927  Weight: 95.3 kg (210 lb) 92.9 kg (204 lb 12.9 oz) 92.9 kg (204 lb 12.9 oz)    INITIAL HISTORY: 56 year old Caucasian male with a past medical history of coronary artery disease with stent placement, history of in-stent restenosis, history of rheumatic fever, COPD, hiatal hernia, presented with abdominal pain. Found to have biliary pancreatitis. Patient was hospitalized for further management.  Consultants: Gastroenterology. Surgery. Cardiology  Procedures:  Transthoracic echocardiogram Study Conclusions - Left ventricle: The cavity size was normal. Wall thickness was increased in a pattern of mild LVH. Systolic function was normal. The estimated ejection fraction was in the range of 55% to 60%. Wall motion was normal; there were no regional wall motion abnormalities. Doppler parameters are consistent with abnormal left ventricular relaxation (grade 1 diastolic dysfunction). The E/e&' ratio is between 8-15, suggesting indeterminate LV filling pressure. - Mitral valve: Mildly thickened leaflets . There was  trivial regurgitation. - Left atrium: The atrium was normal in size. - Right atrium: The atrium was mildly dilated. - Inferior vena cava: The vessel was normal in size. The respirophasic diameter changes were in the normal range (>= 50%), consistent with normal central venous pressure. Impressions: - Compared to a prior study in 2011, the LVEF is unchanged.  Nuclear Stress test Study Result    There was no ST segment deviation noted during stress.  There is a small defect present in the basal anteroseptal location. The defect is non-reversible.  There is a medium defect of moderate severity present in the basal inferior, mid inferior and apical inferior location. The defect is partially reversible and is concerning for inferior infarct with peri infarct ischemia but could also be due to variations in diaphragmatic attenuation.  This is an intermediate risk study.  Nuclear stress EF: 53%.  The left ventricular ejection fraction is mildly decreased (45-54%).   Cardiac catheterization Conclusion   1. Severe single-vessel coronary artery disease involving the mid right coronary artery 2. Continued patency of the stented segment in the right PDA with moderate focal proximal edge restenosis 3. Mild to moderate left circumflex/first OM stenosis 4. Mild nonobstructive LAD stenosis with severe stenosis of a tiny diagonal branch     HOSPITAL COURSE:   Acute biliary pancreatitis/concern for cholangitis/cholecystitis Patient seen by gastroenterology and general surgery. LFTs started improving. Patient's symptoms improved. He was thought to require cholecystectomy. Due to his significant cardiac history, he was seen by cardiology for preoperative evaluation. Patient underwent nuclear stress test which was abnormal. This was followed by cardiac catheterization. He is found to have coronary artery disease in the RCA. However, recommendation was to proceed with the cholecystectomy  and to address the coronary artery disease at  a later date. Patient underwent cholecystectomy yesterday. He has done well postoperatively. Tolerating his diet. Cleared by general surgery for discharge. Cleared by cardiology as well.  History of coronary artery disease Patient with history of stent in the past and also history of in-stent restenosis in the past. Positive stress test results noted. See report of cardiac catheterization above. He is found to have disease involving the RCA. Plan is for intervention at a later date. He remains stable from a cardiac standpoint. Continue his aspirin, beta blocker, nitrates. Statin could be initiated once LFTs are completely normal. This will be done by cardiology as outpatient. They will arrange outpatient follow-up.  History of COPD Stable.  History of tobacco abuse. Counseled.   Abnormal UA Urine culture positive for Escherichia coli. Not thought to be a true infection.   Chronic low back pain Stable  Overall, stable. Ambulating without any difficulty. Okay for discharge.   PERTINENT LABS:  The results of significant diagnostics from this hospitalization (including imaging, microbiology, ancillary and laboratory) are listed below for reference.    Microbiology: Recent Results (from the past 240 hour(s))  Urine culture     Status: Abnormal   Collection Time: 07/07/17  4:43 PM  Result Value Ref Range Status   Specimen Description URINE, RANDOM  Final   Special Requests NONE  Final   Culture (A)  Final    >=100,000 COLONIES/mL ESCHERICHIA COLI Confirmed Extended Spectrum Beta-Lactamase Producer (ESBL)    Report Status 07/09/2017 FINAL  Final   Organism ID, Bacteria ESCHERICHIA COLI (A)  Final      Susceptibility   Escherichia coli - MIC*    AMPICILLIN >=32 RESISTANT Resistant     CEFAZOLIN >=64 RESISTANT Resistant     CEFTRIAXONE >=64 RESISTANT Resistant     CIPROFLOXACIN <=0.25 SENSITIVE Sensitive     GENTAMICIN <=1  SENSITIVE Sensitive     IMIPENEM <=0.25 SENSITIVE Sensitive     NITROFURANTOIN <=16 SENSITIVE Sensitive     TRIMETH/SULFA <=20 SENSITIVE Sensitive     AMPICILLIN/SULBACTAM 16 INTERMEDIATE Intermediate     PIP/TAZO <=4 SENSITIVE Sensitive     Extended ESBL POSITIVE Resistant     * >=100,000 COLONIES/mL ESCHERICHIA COLI  Surgical pcr screen     Status: None   Collection Time: 07/09/17 11:40 PM  Result Value Ref Range Status   MRSA, PCR NEGATIVE NEGATIVE Final   Staphylococcus aureus NEGATIVE NEGATIVE Final    Comment:        The Xpert SA Assay (FDA approved for NASAL specimens in patients over 29 years of age), is one component of a comprehensive surveillance program.  Test performance has been validated by [facility] for patients greater than or equal to 65 year old. It is not intended to diagnose infection nor to guide or monitor treatment.      Labs: Basic Metabolic Panel:  Recent Labs Lab 07/08/17 0534 07/09/17 0616 07/10/17 0607 07/12/17 0439 07/13/17 0510  NA 139 138 142 143 140  K 4.6 3.9 3.8 3.7 4.2  CL 107 108 112* 114* 110  CO2 24 23 23 24 25   GLUCOSE 134* 99 83 118* 205*  BUN 11 11 8 6 6   CREATININE 0.91 0.92 0.86 0.81 0.92  CALCIUM 8.8* 8.4* 8.3* 8.2* 8.7*  MG  --   --  1.8  --   --    Liver Function Tests:  Recent Labs Lab 07/08/17 0534 07/09/17 0616 07/10/17 0607 07/12/17 0439 07/13/17 0510  AST 185* 58* 30 17  20  ALT 591* 319* 196* 108* 90*  ALKPHOS 186* 141* 106 80 78  BILITOT 1.6* 1.5* 1.4* 0.5 0.8  PROT 7.4 6.0* 5.8* 5.7* 5.9*  ALBUMIN 3.8 3.3* 2.9* 2.8* 3.0*    Recent Labs Lab 07/07/17 1509 07/07/17 2156 07/08/17 1034 07/09/17 0616 07/10/17 0607  LIPASE 27 5,890* 870* 90* 36   CBC:  Recent Labs Lab 07/07/17 2156 07/08/17 0534 07/10/17 0607 07/12/17 0439 07/13/17 0510  WBC 12.8* 11.8* 8.3 6.7 7.9  HGB 15.5 15.3 12.0* 11.6* 11.5*  HCT 46.3 44.2 36.0* 35.0* 34.3*  MCV 91.1 90.8 90.2 90.4 90.5  PLT 178 169 128* 173  165    IMAGING STUDIES Dg Chest 2 View  Result Date: 07/07/2017 CLINICAL DATA:  Epigastric pain with cough EXAM: CHEST  2 VIEW COMPARISON:  10/04/2013 FINDINGS: Hyperinflation with emphysematous disease. Linear scarring or atelectasis at both lung bases. No focal consolidation or pleural effusion. Normal heart size. No pneumothorax. Mild wedging of midthoracic vertebra. IMPRESSION: 1. Hyperinflation with emphysematous disease. 2. Linear scarring or atelectasis within both lung bases. 3. No focal pulmonary infiltrate is seen. Electronically Signed   By: Donavan Foil M.D.   On: 07/07/2017 16:41   Ct Abdomen Pelvis W Contrast  Result Date: 07/07/2017 CLINICAL DATA:  Epigastric pain. EXAM: CT ABDOMEN AND PELVIS WITH CONTRAST TECHNIQUE: Multidetector CT imaging of the abdomen and pelvis was performed using the standard protocol following bolus administration of intravenous contrast. CONTRAST:  173mL ISOVUE-300 IOPAMIDOL (ISOVUE-300) INJECTION 61% COMPARISON:  None. FINDINGS: Lower chest: Emphysema noted in both lower lungs with areas of architectural distortion and scarring. Hepatobiliary: No focal abnormality within the liver parenchyma. 2 cm noncalcified stone is identified in the gallbladder fundus. There is also a 7-8 mm calcified gallstone. Subtle pericholecystic fluid and/or gallbladder wall thickening is evident. Common bile duct measures 10 mm diameter in the head of pancreas. Pancreas: No focal mass lesion. No dilatation of the main duct. No intraparenchymal cyst. No peripancreatic edema. Spleen: No splenomegaly. No focal mass lesion. Adrenals/Urinary Tract: No adrenal nodule or mass. 8 mm hypoattenuating subcapsular lesion in the interpolar left kidney is too small to characterize but likely a cyst. Right kidney unremarkable. No evidence for hydroureter. The urinary bladder appears normal for the degree of distention. Stomach/Bowel: Stomach is nondistended. No gastric wall thickening. No evidence of  outlet obstruction. Duodenum is normally positioned as is the ligament of Treitz. No small bowel wall thickening. No small bowel dilatation. The terminal ileum is normal. The appendix is normal. Diverticular changes are noted in the left colon without evidence of diverticulitis. Vascular/Lymphatic: There is abdominal aortic atherosclerosis without aneurysm. There is no gastrohepatic or hepatoduodenal ligament lymphadenopathy. No intraperitoneal or retroperitoneal lymphadenopathy. No pelvic sidewall lymphadenopathy. Reproductive: The prostate gland and seminal vesicles have normal imaging features. Other: No intraperitoneal free fluid. Musculoskeletal: A left-sided Spigelian hernia is identified, containing only fat and dissecting into the space between the internal and external oblique muscles with hernia sac measuring 2.4 x 6.7 x 5.9 cm. Bone windows reveal no worrisome lytic or sclerotic osseous lesions. IMPRESSION: 1. Gallstones with CT findings suggesting pericholecystic fluid and/or gallbladder wall thickening. Mild intra and extrahepatic biliary duct dilatation is associated. Acute cholecystitis is a concern by CT. 2. Left-sided Spigelian abdominal wall hernia containing only fat which dissects into the space between the internal and external oblique muscles. Electronically Signed   By: Misty Stanley M.D.   On: 07/07/2017 17:59   Nm Myocar Multi W/spect W/wall Motion /  Ef  Result Date: 07/10/2017  There was no ST segment deviation noted during stress.  There is a small defect present in the basal anteroseptal location. The defect is non-reversible.  There is a medium defect of moderate severity present in the basal inferior, mid inferior and apical inferior location. The defect is partially reversible and is concerning for inferior infarct with peri infarct ischemia but could also be due to variations in diaphragmatic attenuation.  This is an intermediate risk study.  Nuclear stress EF: 53%.  The  left ventricular ejection fraction is mildly decreased (45-54%).     DISCHARGE EXAMINATION: Vitals:   07/12/17 1532 07/12/17 2100 07/13/17 0235 07/13/17 0517  BP:  129/75 122/68 121/66  Pulse:  (!) 48 64   Resp:  20 18 18   Temp: 97.8 F (36.6 C) 98.9 F (37.2 C) 98.7 F (37.1 C) 97.7 F (36.5 C)  TempSrc: Oral Oral Oral Oral  SpO2: 92% 93% 92% 94%  Weight:      Height:       General appearance: alert, cooperative, appears stated age and no distress Resp: clear to auscultation bilaterally Cardio: regular rate and rhythm, S1, S2 normal, no murmur, click, rub or gallop GI: soft, non-tender; bowel sounds normal; no masses,  no organomegaly Extremities: extremities normal, atraumatic, no cyanosis or edema  DISPOSITION: Home  Discharge Instructions    Call MD for:  difficulty breathing, headache or visual disturbances    Complete by:  As directed    Call MD for:  extreme fatigue    Complete by:  As directed    Call MD for:  persistant dizziness or light-headedness    Complete by:  As directed    Call MD for:  persistant nausea and vomiting    Complete by:  As directed    Call MD for:  severe uncontrolled pain    Complete by:  As directed    Call MD for:  temperature >100.4    Complete by:  As directed    Diet - low sodium heart healthy    Complete by:  As directed    Discharge instructions    Complete by:  As directed    Please keep your follow up appointments. Seek attention immediately if you develop chest pain or shortness of breath.  You were cared for by a hospitalist during your hospital stay. If you have any questions about your discharge medications or the care you received while you were in the hospital after you are discharged, you can call the unit and asked to speak with the hospitalist on call if the hospitalist that took care of you is not available. Once you are discharged, your primary care physician will handle any further medical issues. Please note that NO  REFILLS for any discharge medications will be authorized once you are discharged, as it is imperative that you return to your primary care physician (or establish a relationship with a primary care physician if you do not have one) for your aftercare needs so that they can reassess your need for medications and monitor your lab values. If you do not have a primary care physician, you can call [phone] for a physician referral.   Increase activity slowly    Complete by:  As directed       ALLERGIES:  Allergies  Allergen Reactions  . Other Anaphylaxis and Other (See Comments)    CANNOT TOLERATE ANYTHING CONTAINING PRESERVATIVES!! Instant potatoes = Trip to the ER because his throat "  closed"  . Sulfites Anaphylaxis  . Tomato Anaphylaxis     Discharge Medication List as of 07/13/2017 10:31 AM    START taking these medications   Details  docusate sodium (COLACE) 100 MG capsule Take 1 capsule (100 mg total) by mouth 2 (two) times daily., Starting Fri 07/13/2017, Print    isosorbide mononitrate (IMDUR) 30 MG 24 hr tablet Take 1 tablet (30 mg total) by mouth daily., Starting Fri 07/13/2017, Print    metoprolol tartrate (LOPRESSOR) 25 MG tablet Take 1 tablet (25 mg total) by mouth 2 (two) times daily., Starting Fri 07/13/2017, Print    oxyCODONE (OXY IR/ROXICODONE) 5 MG immediate release tablet Take 1 tablet (5 mg total) by mouth every 6 (six) hours as needed for severe pain., Starting Fri 07/13/2017, Print      CONTINUE these medications which have NOT CHANGED   Details  albuterol (PROVENTIL) (2.5 MG/3ML) 0.083% nebulizer solution Take 3 mLs (2.5 mg total) by nebulization every 6 (six) hours as needed for wheezing., Starting 09/08/2013, Until Discontinued, Print    aspirin EC 81 MG EC tablet Take 1 tablet (81 mg total) by mouth daily., Starting 06/27/2013, Until Discontinued, No Print    nitroGLYCERIN (NITROSTAT) 0.4 MG SL tablet Place 1 tablet (0.4 mg total) under the tongue every 5 (five)  minutes as needed for chest pain., Starting 06/27/2013, Until Discontinued, Print    ranitidine (ZANTAC) 150 MG tablet Take 1 tablet (150 mg total) by mouth 2 (two) times daily., Starting Wed 12/24/2013, Print      STOP taking these medications     cyclobenzaprine (FLEXERIL) 10 MG tablet      ibuprofen (ADVIL,MOTRIN) 200 MG tablet      traMADol (ULTRAM) 50 MG tablet          Follow-up Information    [facility] Follow up on 07/24/2017.   Specialty:  General Surgery Why:  Your appointment is at 11:30 AM, be at the office 30 minutes early for check in.  Bring Photo ID, and insurance information.   Contact information: [place] [address] [phone]        Troy Sine, MD Follow up.   Specialty:  Cardiology Why:  his office should call to schedule appointment Contact information: [address] [place] [address] [phone]           TOTAL DISCHARGE TIME: 75 minutes  [facility] Pager [phone]  07/13/2017, 2:52 PM

## 2017-07-13 NOTE — Care Management Note (Signed)
Case Management Note  Patient Details  Name: John Forbes MRN: [id number] Date of Birth: October 11, 1961  Subjective/Objective:                    Action/Plan: Pt discharged home with self care. No has hospital f/u, insurance and transportation home. No further needs per CM.   Expected Discharge Date:  07/13/17               Expected Discharge Plan:  Home/Self Care  In-House Referral:     Discharge planning Services     Post Acute Care Choice:    Choice offered to:     DME Arranged:    DME Agency:     HH Arranged:    HH Agency:     Status of Service:  Completed, signed off  If discussed at [employer] of Stay Meetings, dates discussed:    Additional Comments:  Pollie Friar, RN 07/13/2017, 11:30 AM

## 2017-07-13 NOTE — Progress Notes (Signed)
Progress Note  Patient Name: John Forbes Date of Encounter: 07/13/2017  Primary Cardiologist: Dr Radford Pax  Subjective   Sedated post op, denies chest pain  Inpatient Medications    Scheduled Meds: . aspirin EC  81 mg Oral Daily  . docusate sodium  100 mg Oral BID  . isosorbide mononitrate  30 mg Oral Daily  . mouth rinse  15 mL Mouth Rinse BID  . metoprolol tartrate  25 mg Oral BID  . regadenoson  0.4 mg Intravenous Once  . sodium chloride flush  3 mL Intravenous Q12H   Continuous Infusions: . sodium chloride 100 mL/hr at 07/13/17 0600  . sodium chloride    . famotidine (PEPCID) IV Stopped (07/12/17 2233)  . lactated ringers    . piperacillin-tazobactam (ZOSYN)  IV Stopped (07/13/17 0637)   PRN Meds: sodium chloride, acetaminophen, ipratropium-albuterol, morphine injection, ondansetron **OR** ondansetron (ZOFRAN) IV, oxyCODONE, sodium chloride flush   Vital Signs    Vitals:   07/12/17 1532 07/12/17 2100 07/13/17 0235 07/13/17 0517  BP:  129/75 122/68 121/66  Pulse:  (!) 48 64   Resp:  20 18 18   Temp: 97.8 F (36.6 C) 98.9 F (37.2 C) 98.7 F (37.1 C) 97.7 F (36.5 C)  TempSrc: Oral Oral Oral Oral  SpO2: 92% 93% 92% 94%  Weight:      Height:        Intake/Output Summary (Last 24 hours) at 07/13/17 0946 Last data filed at 07/13/17 0746  Gross per 24 hour  Intake             3150 ml  Output             1010 ml  Net             2140 ml   Filed Weights   07/07/17 2155 07/08/17 0222 07/12/17 0927  Weight: 210 lb (95.3 kg) 204 lb 12.9 oz (92.9 kg) 204 lb 12.9 oz (92.9 kg)    Telemetry    NSR - Personally Reviewed  ECG    Post Op EKG pendning-   Will check.  Physical Exam   GEN: No acute distress.  No chest pain or dyspnea Neck: No JVD or carotid bruits Cardiac: RR no ectopy; 1/6 sem no s3 Respiratory: no wheezing GI: residual tenderness RUQ MS: no clubbing, cyanosis, edema Neuro:  Nonfocal  Psych: Normal affect   Labs     Chemistry  Recent Labs Lab 07/10/17 0607 07/12/17 0439 07/13/17 0510  NA 142 143 140  K 3.8 3.7 4.2  CL 112* 114* 110  CO2 23 24 25   GLUCOSE 83 118* 205*  BUN 8 6 6   CREATININE 0.86 0.81 0.92  CALCIUM 8.3* 8.2* 8.7*  PROT 5.8* 5.7* 5.9*  ALBUMIN 2.9* 2.8* 3.0*  AST 30 17 20   ALT 196* 108* 90*  ALKPHOS 106 80 78  BILITOT 1.4* 0.5 0.8  GFRNONAA >60 >60 >60  GFRAA >60 >60 >60  ANIONGAP 7 5 5      Hematology  Recent Labs Lab 07/10/17 0607 07/12/17 0439 07/13/17 0510  WBC 8.3 6.7 7.9  RBC 3.99* 3.87* 3.79*  HGB 12.0* 11.6* 11.5*  HCT 36.0* 35.0* 34.3*  MCV 90.2 90.4 90.5  MCH 30.1 30.0 30.3  MCHC 33.3 33.1 33.5  RDW 14.9 15.3 15.0  PLT 128* 173 165    Cardiac EnzymesNo results for input(s): TROPONINI in the last 168 hours. No results for input(s): TROPIPOC in the last 168 hours.   BNPNo  results for input(s): BNP, PROBNP in the last 168 hours.   DDimer No results for input(s): DDIMER in the last 168 hours.   Radiology    No results found.  Cardiac Studies   Cath 07/11/17 Conclusion   1. Severe single-vessel coronary artery disease involving the mid right coronary artery 2. Continued patency of the stented segment in the right PDA with moderate focal proximal edge restenosis 3. Mild to moderate left circumflex/first OM stenosis 4. Mild nonobstructive LAD stenosis with severe stenosis of a tiny diagonal branch  Recommendations: Discussed case and reviewed films with Dr. Claiborne Billings. The patient does not have symptoms of unstable angina and he has noncritical coronary artery disease. His films are compared to his previous study from 2014. There is progressive stenosis in the mid right coronary artery and PCI would be appropriate. However, the patient has gallstone pancreatitis and will require laparoscopic cholecystectomy. It seems most prudent to proceed with his surgery on medical therapy for coronary artery disease as planned tomorrow. His PCI can be done  electively after he recovers from surgery. PCI prior to surgery would likely result in surgical delay and potential complications related to his gallstone pancreatitis. He is on good medical therapy with metoprolol and isosorbide and appears to be well beta blocked at present. Case also discussed with the surgical team, Dr. Kae Heller.     Patient Profile     56 y.o. male with a h/o CAD, HL, tob abuse, and GERD who was admitted 7/21 2/2 gallstone pancreatitis and cholecystitis.  Assessment & Plan    1.  Gallstone pancreatitis/cholecystitis: S/P laproscopic cholecystectomy yesterday  2. CAD:  see cath report.   I reviewed the angiographic findings in the cardiac catheterization   with Dr. Burt Knack.  The mid RCA lesion is 75-80% and somewhat ulcerated.  At that time we made the decision for him to undergo his cholecystectomy as had been scheduled with initial medical management with plans for PCI to the RCA in several weeks following stabilization. No recurrent chest pain with re-initiation of anti-ischemic meds. Post op ECG was never done.  Telemetry stable.   [place] for [employer] today;  Will need to arrange for f/u  initial ov with me in ~ 2-3 weeks to discuss and plan  PCI.   3.  Tob Abuse:  Cessation advised.  4.  HL:  LDL 110.  Will ultimately need addition of statin but wait until LFTs are normal for some time prior to initiating.     Signed, John Majestic, MD  07/13/2017, 9:46 AM
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
## 2017-07-16 NOTE — Anesthesia Postprocedure Evaluation (Signed)
Anesthesia Post Note  Patient: John Forbes  Procedure(s) Performed: Procedure(s) (LRB): LAPAROSCOPIC CHOLECYSTECTOMY (N/A)     Patient location during evaluation: PACU Anesthesia Type: General Level of consciousness: awake Pain management: pain level controlled Vital Signs Assessment: post-procedure vital signs reviewed and stable Respiratory status: spontaneous breathing Cardiovascular status: stable Postop Assessment: no signs of nausea or vomiting Anesthetic complications: no    Last Vitals:  Vitals:   07/13/17 0235 07/13/17 0517  BP: 122/68 121/66  Pulse: 64   Resp: 18 18  Temp: 37.1 C 36.5 C    Last Pain:  Vitals:   07/13/17 1012  TempSrc:   PainSc: 2    Pain Goal:                 [name],JOHN Paublo Warshawsky

## 2017-08-03 ENCOUNTER — Ambulatory Visit (INDEPENDENT_AMBULATORY_CARE_PROVIDER_SITE_OTHER): Payer: Medicare Other | Admitting: Physician Assistant

## 2017-08-03 ENCOUNTER — Encounter: Payer: Self-pay | Admitting: Physician Assistant

## 2017-08-03 VITALS — BP 112/62 | HR 57 | Ht 72.0 in | Wt 205.0 lb

## 2017-08-03 DIAGNOSIS — E785 Hyperlipidemia, unspecified: Secondary | ICD-10-CM

## 2017-08-03 DIAGNOSIS — Z7901 Long term (current) use of anticoagulants: Secondary | ICD-10-CM

## 2017-08-03 DIAGNOSIS — I2510 Atherosclerotic heart disease of native coronary artery without angina pectoris: Secondary | ICD-10-CM

## 2017-08-03 DIAGNOSIS — J449 Chronic obstructive pulmonary disease, unspecified: Secondary | ICD-10-CM

## 2017-08-03 DIAGNOSIS — Z01810 Encounter for preprocedural cardiovascular examination: Secondary | ICD-10-CM | POA: Diagnosis not present

## 2017-08-03 DIAGNOSIS — Z720 Tobacco use: Secondary | ICD-10-CM | POA: Diagnosis not present

## 2017-08-03 DIAGNOSIS — M79606 Pain in leg, unspecified: Secondary | ICD-10-CM

## 2017-08-03 NOTE — Patient Instructions (Signed)
Medication Instructions:   No changes  Labwork:   BMET, CBC, PT INR before your heart catheterization. NO MORE than 2 weeks prior to, and NO LATER than 2 days prior to cath. Catheterization scheduled for Sept 5th. Get labs drawn in our office sometime between August 23rd and Sept 3rd.  Testing/Procedures:  Your physician has also requested that you have an ankle brachial index (ABI). During this test an ultrasound and blood pressure cuff are used to evaluate the arteries that supply the arms and legs with blood. Allow thirty minutes for this exam. There are no restrictions or special instructions.    Follow-Up:  With Dr. Claiborne Billings or Almyra Deforest PA 2 weeks following cath.  If you need a refill on your cardiac medications before your next appointment, please call your pharmacy.   Any Other Special Instructions Will Be Listed Below (If Applicable).  Full cath instructions below.    [facility] [address] [place] [address] Dept: [phone] Loc: [phone]  CUSTER PIMENTA  08/03/2017  You are scheduled for a Cardiac Catheterization on Wednesday, September 5 with Dr. Shelva Majestic.  1. Please arrive at the [facility] (Main Entrance A) at [facility]: [address] [place], [place] [address] at 6:30 AM (two hours before your procedure to ensure your preparation). Free valet parking service is available.   Special note: Every effort is made to have your procedure done on time. Please understand that emergencies sometimes delay scheduled procedures.  2. Diet: Do not eat or drink anything after midnight prior to your procedure except sips of water to take medications.  3. Labs: Come to the [facility] office to get your pre-procedure labwork on a date between August 23rd and Sept 3rd.  4. Medication instructions in preparation for your procedure:  On the morning of your  procedure, take your regular morning medicines.  You may use sips of water.  5. Plan for one night stay--bring personal belongings. 6. Bring a current list of your medications and current insurance cards. 7. You MUST have a responsible person to drive you home. 8. Someone MUST be with you the first 24 hours after you arrive home or your discharge will be delayed. 9. Please wear clothes that are easy to get on and off and wear slip-on shoes.  Thank you for allowing [place] to care for you!   -- [facility]
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
## 2017-08-04 ENCOUNTER — Encounter: Payer: Self-pay | Admitting: Physician Assistant

## 2017-08-04 ENCOUNTER — Other Ambulatory Visit: Payer: Self-pay | Admitting: Physician Assistant

## 2017-08-08 ENCOUNTER — Other Ambulatory Visit: Payer: Self-pay | Admitting: Physician Assistant

## 2017-08-17 ENCOUNTER — Other Ambulatory Visit: Payer: Self-pay | Admitting: Physician Assistant

## 2017-08-17 DIAGNOSIS — R0989 Other specified symptoms and signs involving the circulatory and respiratory systems: Secondary | ICD-10-CM

## 2017-08-17 DIAGNOSIS — Z7901 Long term (current) use of anticoagulants: Secondary | ICD-10-CM | POA: Diagnosis not present

## 2017-08-17 DIAGNOSIS — Z01810 Encounter for preprocedural cardiovascular examination: Secondary | ICD-10-CM | POA: Diagnosis not present

## 2017-08-17 DIAGNOSIS — M79606 Pain in leg, unspecified: Secondary | ICD-10-CM

## 2017-08-18 LAB — BASIC METABOLIC PANEL
BUN/Creatinine Ratio: 13 (ref 9–20)
BUN: 11 mg/dL (ref 6–24)
CALCIUM: 9.2 mg/dL (ref 8.7–10.2)
CO2: 23 mmol/L (ref 20–29)
CREATININE: 0.87 mg/dL (ref 0.76–1.27)
Chloride: 103 mmol/L (ref 96–106)
GFR calc non Af Amer: 96 mL/min/{1.73_m2} (ref >59)
GFR, EST AFRICAN AMERICAN: 112 mL/min/{1.73_m2} (ref >59)
Glucose: 90 mg/dL (ref 65–99)
Potassium: 4.6 mmol/L (ref 3.5–5.2)
Sodium: 143 mmol/L (ref 134–144)

## 2017-08-18 LAB — CBC
Hematocrit: 45.2 % (ref 37.5–51.0)
Hemoglobin: 14.9 g/dL (ref 13.0–17.7)
MCH: 30.7 pg (ref 26.6–33.0)
MCHC: 33 g/dL (ref 31.5–35.7)
MCV: 93 fL (ref 79–97)
Platelets: 168 x10E3/uL (ref 150–379)
RBC: 4.86 x10E6/uL (ref 4.14–5.80)
RDW: 14.4 % (ref 12.3–15.4)
WBC: 7.6 x10E3/uL (ref 3.4–10.8)

## 2017-08-18 LAB — PROTIME-INR
INR: 1 (ref 0.8–1.2)
PROTHROMBIN TIME: 10.3 s (ref 9.1–12.0)

## 2017-08-21 ENCOUNTER — Other Ambulatory Visit: Payer: Self-pay | Admitting: Physician Assistant

## 2017-08-21 NOTE — Progress Notes (Signed)
Precath labs normal. Normal kidney function and red blood cell count

## 2017-08-22 ENCOUNTER — Ambulatory Visit (HOSPITAL_COMMUNITY)
Admission: RE | Admit: 2017-08-22 | Discharge: 2017-08-23 | Disposition: A | Discharge status: Home / Self Care | Payer: Medicare Other | Source: Ambulatory Visit | Attending: Cardiovascular Disease | Admitting: Cardiovascular Disease

## 2017-08-22 ENCOUNTER — Encounter (HOSPITAL_COMMUNITY)
Admission: RE | Disposition: A | Discharge status: Home / Self Care | Payer: Self-pay | Source: Ambulatory Visit | Attending: Cardiovascular Disease

## 2017-08-22 ENCOUNTER — Encounter (HOSPITAL_COMMUNITY): Payer: Self-pay | Admitting: Cardiovascular Disease

## 2017-08-22 DIAGNOSIS — Z833 Family history of diabetes mellitus: Secondary | ICD-10-CM | POA: Insufficient documentation

## 2017-08-22 DIAGNOSIS — E785 Hyperlipidemia, unspecified: Secondary | ICD-10-CM | POA: Insufficient documentation

## 2017-08-22 DIAGNOSIS — F17210 Nicotine dependence, cigarettes, uncomplicated: Secondary | ICD-10-CM | POA: Insufficient documentation

## 2017-08-22 DIAGNOSIS — Z809 Family history of malignant neoplasm, unspecified: Secondary | ICD-10-CM | POA: Insufficient documentation

## 2017-08-22 DIAGNOSIS — Z825 Family history of asthma and other chronic lower respiratory diseases: Secondary | ICD-10-CM | POA: Insufficient documentation

## 2017-08-22 DIAGNOSIS — Z8679 Personal history of other diseases of the circulatory system: Secondary | ICD-10-CM | POA: Insufficient documentation

## 2017-08-22 DIAGNOSIS — Z9049 Acquired absence of other specified parts of digestive tract: Secondary | ICD-10-CM | POA: Insufficient documentation

## 2017-08-22 DIAGNOSIS — K219 Gastro-esophageal reflux disease without esophagitis: Secondary | ICD-10-CM | POA: Insufficient documentation

## 2017-08-22 DIAGNOSIS — Z79899 Other long term (current) drug therapy: Secondary | ICD-10-CM | POA: Diagnosis not present

## 2017-08-22 DIAGNOSIS — I2510 Atherosclerotic heart disease of native coronary artery without angina pectoris: Secondary | ICD-10-CM | POA: Insufficient documentation

## 2017-08-22 DIAGNOSIS — Z91018 Allergy to other foods: Secondary | ICD-10-CM | POA: Insufficient documentation

## 2017-08-22 DIAGNOSIS — J449 Chronic obstructive pulmonary disease, unspecified: Secondary | ICD-10-CM | POA: Insufficient documentation

## 2017-08-22 DIAGNOSIS — Z86010 Personal history of colonic polyps: Secondary | ICD-10-CM | POA: Diagnosis not present

## 2017-08-22 DIAGNOSIS — Z7982 Long term (current) use of aspirin: Secondary | ICD-10-CM | POA: Insufficient documentation

## 2017-08-22 DIAGNOSIS — Z955 Presence of coronary angioplasty implant and graft: Secondary | ICD-10-CM | POA: Insufficient documentation

## 2017-08-22 DIAGNOSIS — K449 Diaphragmatic hernia without obstruction or gangrene: Secondary | ICD-10-CM | POA: Insufficient documentation

## 2017-08-22 DIAGNOSIS — I472 Ventricular tachycardia: Secondary | ICD-10-CM | POA: Diagnosis not present

## 2017-08-22 DIAGNOSIS — Z8249 Family history of ischemic heart disease and other diseases of the circulatory system: Secondary | ICD-10-CM | POA: Diagnosis not present

## 2017-08-22 DIAGNOSIS — Z9102 Food additives allergy status: Secondary | ICD-10-CM | POA: Diagnosis not present

## 2017-08-22 DIAGNOSIS — M545 Low back pain: Secondary | ICD-10-CM | POA: Insufficient documentation

## 2017-08-22 DIAGNOSIS — G8929 Other chronic pain: Secondary | ICD-10-CM | POA: Diagnosis not present

## 2017-08-22 HISTORY — DX: Unspecified osteoarthritis, unspecified site: M19.90

## 2017-08-22 HISTORY — PX: CARDIAC CATHETERIZATION: SHX172

## 2017-08-22 HISTORY — PX: CORONARY STENT INTERVENTION: CATH118234

## 2017-08-22 LAB — POCT ACTIVATED CLOTTING TIME: Activated Clotting Time: 389 seconds

## 2017-08-22 SURGERY — CORONARY STENT INTERVENTION
Anesthesia: LOCAL

## 2017-08-22 MED ORDER — ATORVASTATIN CALCIUM 80 MG PO TABS
80.0000 mg | ORAL_TABLET | Freq: Every day | ORAL | Status: DC
  Administered 2017-08-22: 18:00:00 80 mg via ORAL
  Filled 2017-08-22: qty 1

## 2017-08-22 MED ORDER — SODIUM CHLORIDE 0.9 % WEIGHT BASED INFUSION
3.0000 mL/kg/h | INTRAVENOUS | Status: DC
  Administered 2017-08-22: 3 mL/kg/h via INTRAVENOUS

## 2017-08-22 MED ORDER — NITROGLYCERIN 1 MG/10 ML FOR IR/CATH LAB
INTRA_ARTERIAL | Status: AC
  Filled 2017-08-22: qty 10

## 2017-08-22 MED ORDER — ASPIRIN 81 MG PO CHEW
81.0000 mg | CHEWABLE_TABLET | Freq: Every day | ORAL | Status: DC
  Administered 2017-08-23: 81 mg via ORAL
  Filled 2017-08-22: qty 1

## 2017-08-22 MED ORDER — BIVALIRUDIN BOLUS VIA INFUSION - CUPID
INTRAVENOUS | Status: DC | PRN
  Administered 2017-08-22: 70.05 mg via INTRAVENOUS

## 2017-08-22 MED ORDER — SODIUM CHLORIDE 0.9% FLUSH: 3.0000 mL | INTRAVENOUS | Status: DC | PRN

## 2017-08-22 MED ORDER — SODIUM CHLORIDE 0.9 % IV SOLN
INTRAVENOUS | Status: AC | PRN
  Administered 2017-08-22 (×2): 1.75 mg/kg/h via INTRAVENOUS

## 2017-08-22 MED ORDER — BIVALIRUDIN TRIFLUOROACETATE 250 MG IV SOLR
INTRAVENOUS | Status: AC
  Filled 2017-08-22: qty 250

## 2017-08-22 MED ORDER — SODIUM CHLORIDE 0.9 % IV SOLN: 250.0000 mL | INTRAVENOUS | Status: DC | PRN

## 2017-08-22 MED ORDER — HEPARIN (PORCINE) IN NACL 2-0.9 UNIT/ML-% IJ SOLN
INTRAMUSCULAR | Status: AC
  Filled 2017-08-22: qty 1000

## 2017-08-22 MED ORDER — SODIUM CHLORIDE 0.9 % IV SOLN
INTRAVENOUS | Status: AC
  Administered 2017-08-22 (×2): via INTRAVENOUS

## 2017-08-22 MED ORDER — SODIUM CHLORIDE 0.9 % IV SOLN: INTRAVENOUS | Status: DC

## 2017-08-22 MED ORDER — SODIUM CHLORIDE 0.9% FLUSH: 3.0000 mL | Freq: Two times a day (BID) | INTRAVENOUS | Status: DC

## 2017-08-22 MED ORDER — IOPAMIDOL (ISOVUE-370) INJECTION 76%
INTRAVENOUS | Status: DC | PRN
  Administered 2017-08-22: 115 mL via INTRA_ARTERIAL

## 2017-08-22 MED ORDER — SODIUM CHLORIDE 0.9% FLUSH
3.0000 mL | Freq: Two times a day (BID) | INTRAVENOUS | Status: DC
  Administered 2017-08-22 – 2017-08-23 (×2): 3 mL via INTRAVENOUS

## 2017-08-22 MED ORDER — VERAPAMIL HCL 2.5 MG/ML IV SOLN
INTRAVENOUS | Status: DC | PRN
  Administered 2017-08-22: 10 mL via INTRA_ARTERIAL

## 2017-08-22 MED ORDER — PNEUMOCOCCAL VAC POLYVALENT 25 MCG/0.5ML IJ INJ: 0.5000 mL | INJECTION | INTRAMUSCULAR | Status: DC | PRN

## 2017-08-22 MED ORDER — FENTANYL CITRATE (PF) 100 MCG/2ML IJ SOLN
INTRAMUSCULAR | Status: DC | PRN
  Administered 2017-08-22: 25 ug via INTRAVENOUS

## 2017-08-22 MED ORDER — METOPROLOL TARTRATE 12.5 MG HALF TABLET
12.5000 mg | ORAL_TABLET | Freq: Two times a day (BID) | ORAL | Status: DC
  Administered 2017-08-22 – 2017-08-23 (×3): 12.5 mg via ORAL
  Filled 2017-08-22 (×3): qty 1

## 2017-08-22 MED ORDER — CLOPIDOGREL BISULFATE 300 MG PO TABS
ORAL_TABLET | ORAL | Status: AC
  Filled 2017-08-22: qty 2

## 2017-08-22 MED ORDER — ATORVASTATIN CALCIUM 80 MG PO TABS
80.0000 mg | ORAL_TABLET | ORAL | Status: AC
  Administered 2017-08-22: 80 mg via ORAL
  Filled 2017-08-22: qty 1

## 2017-08-22 MED ORDER — IOPAMIDOL (ISOVUE-370) INJECTION 76%
INTRAVENOUS | Status: AC
  Filled 2017-08-22: qty 125

## 2017-08-22 MED ORDER — SODIUM CHLORIDE 0.9 % IV SOLN: INTRAVENOUS | Status: AC

## 2017-08-22 MED ORDER — DIAZEPAM 5 MG PO TABS: 5.0000 mg | ORAL_TABLET | Freq: Four times a day (QID) | ORAL | Status: DC | PRN

## 2017-08-22 MED ORDER — SODIUM CHLORIDE 0.9 % WEIGHT BASED INFUSION: 1.0000 mL/kg/h | INTRAVENOUS | Status: DC

## 2017-08-22 MED ORDER — SODIUM CHLORIDE 0.9% FLUSH
3.0000 mL | INTRAVENOUS | Status: DC | PRN
  Administered 2017-08-22: 22:00:00 3 mL via INTRAVENOUS
  Filled 2017-08-22: qty 3

## 2017-08-22 MED ORDER — ACETAMINOPHEN 325 MG PO TABS: 650.0000 mg | ORAL_TABLET | ORAL | Status: DC | PRN

## 2017-08-22 MED ORDER — ISOSORBIDE MONONITRATE ER 30 MG PO TB24
30.0000 mg | ORAL_TABLET | Freq: Every day | ORAL | Status: DC
  Administered 2017-08-22 – 2017-08-23 (×2): 30 mg via ORAL
  Filled 2017-08-22 (×2): qty 1

## 2017-08-22 MED ORDER — MIDAZOLAM HCL 2 MG/2ML IJ SOLN
INTRAMUSCULAR | Status: DC | PRN
  Administered 2017-08-22: 2 mg via INTRAVENOUS

## 2017-08-22 MED ORDER — LIDOCAINE HCL (PF) 1 % IJ SOLN
INTRAMUSCULAR | Status: DC | PRN
  Administered 2017-08-22: 2 mL via INTRADERMAL

## 2017-08-22 MED ORDER — CLOPIDOGREL BISULFATE 300 MG PO TABS
ORAL_TABLET | ORAL | Status: DC | PRN
  Administered 2017-08-22: 600 mg via ORAL

## 2017-08-22 MED ORDER — ONDANSETRON HCL 4 MG/2ML IJ SOLN: 4.0000 mg | Freq: Four times a day (QID) | INTRAMUSCULAR | Status: DC | PRN

## 2017-08-22 MED ORDER — ASPIRIN 81 MG PO CHEW
81.0000 mg | CHEWABLE_TABLET | ORAL | Status: AC
  Administered 2017-08-22: 81 mg via ORAL

## 2017-08-22 MED ORDER — HEPARIN (PORCINE) IN NACL 2-0.9 UNIT/ML-% IJ SOLN
INTRAMUSCULAR | Status: AC | PRN
  Administered 2017-08-22: 1000 mL

## 2017-08-22 MED ORDER — ATORVASTATIN CALCIUM 80 MG PO TABS
80.0000 mg | ORAL_TABLET | Freq: Every day | ORAL | Status: DC
  Filled 2017-08-22: qty 1

## 2017-08-22 MED ORDER — NITROGLYCERIN 1 MG/10 ML FOR IR/CATH LAB
INTRA_ARTERIAL | Status: DC | PRN
  Administered 2017-08-22 (×2): 200 ug via INTRACORONARY

## 2017-08-22 MED ORDER — HEPARIN SODIUM (PORCINE) 1000 UNIT/ML IJ SOLN
INTRAMUSCULAR | Status: AC
  Filled 2017-08-22: qty 1

## 2017-08-22 MED ORDER — LIDOCAINE HCL (PF) 1 % IJ SOLN
INTRAMUSCULAR | Status: AC
  Filled 2017-08-22: qty 30

## 2017-08-22 MED ORDER — MIDAZOLAM HCL 2 MG/2ML IJ SOLN
INTRAMUSCULAR | Status: AC
  Filled 2017-08-22: qty 2

## 2017-08-22 MED ORDER — FENTANYL CITRATE (PF) 100 MCG/2ML IJ SOLN
INTRAMUSCULAR | Status: AC
  Filled 2017-08-22: qty 2

## 2017-08-22 MED ORDER — ASPIRIN 81 MG PO CHEW
CHEWABLE_TABLET | ORAL | Status: AC
  Administered 2017-08-22: 81 mg via ORAL
  Filled 2017-08-22: qty 1

## 2017-08-22 MED ORDER — VERAPAMIL HCL 2.5 MG/ML IV SOLN
INTRAVENOUS | Status: AC
  Filled 2017-08-22: qty 2

## 2017-08-22 MED ORDER — CLOPIDOGREL BISULFATE 75 MG PO TABS
75.0000 mg | ORAL_TABLET | Freq: Every day | ORAL | Status: DC
  Administered 2017-08-23: 10:00:00 75 mg via ORAL
  Filled 2017-08-22: qty 1

## 2017-08-22 MED ORDER — SODIUM CHLORIDE 0.9 % IV SOLN
250.0000 mL | INTRAVENOUS | Status: DC | PRN
Start: 2017-08-22 — End: 2017-08-23

## 2017-08-22 SURGICAL SUPPLY — 17 items
BALLN EUPHORA RX 2.5X12 (BALLOONS) ×2
BALLN SAPPHIRE ~~LOC~~ 3.75X15 (BALLOONS) ×1 IMPLANT
BALLOON EUPHORA RX 2.5X12 (BALLOONS) IMPLANT
CATH LAUNCHER 6FR JR4 (CATHETERS) ×1 IMPLANT
DEVICE RAD COMP TR BAND LRG (VASCULAR PRODUCTS) ×1 IMPLANT
GLIDESHEATH SLEND SS 6F .021 (SHEATH) ×1 IMPLANT
GUIDEWIRE INQWIRE 1.5J.035X260 (WIRE) IMPLANT
INQWIRE 1.5J .035X260CM (WIRE) ×2
KIT ENCORE 26 ADVANTAGE (KITS) ×1 IMPLANT
KIT HEART LEFT (KITS) ×2 IMPLANT
PACK CARDIAC CATHETERIZATION (CUSTOM PROCEDURE TRAY) ×2 IMPLANT
STENT RESOLUTE ONYX 3.5X22 (Permanent Stent) ×1 IMPLANT
TRANSDUCER W/STOPCOCK (MISCELLANEOUS) ×2 IMPLANT
TUBING CIL FLEX 10 FLL-RA (TUBING) ×2 IMPLANT
WIRE COUGAR XT STRL 190CM (WIRE) ×1 IMPLANT
WIRE MARVEL STR TIP 190CM (WIRE) ×1 IMPLANT
WIRE PT2 MS 185 (WIRE) ×1 IMPLANT

## 2017-08-22 NOTE — Interval H&P Note (Signed)
History and Physical Interval Note:  08/22/2017 8:02 AM  John Forbes  has presented today for surgery, with the diagnosis of cad with 75% blockage of RCA  The various methods of treatment have been discussed with the patient and family. After consideration of risks, benefits and other options for treatment, the patient has consented to  Procedure(s): CORONARY STENT INTERVENTION (N/A) as a surgical intervention .  The patient's history has been reviewed, patient examined, no change in status, stable for surgery.  I have reviewed the patient's chart and labs.  Questions were answered to the patient's satisfaction.     Shelva Majestic

## 2017-08-22 NOTE — Care Management Note (Addendum)
Case Management Note  Patient Details  Name: John Forbes MRN: [id number] Date of Birth: [birth year]  Subjective/Objective:    From home, s/p coronary stent intervention, will be on plavix.  He does not have a PCP, NCM scheduled follow up apt at [name] for  9/26 at 1:30.  Patient states he does not want to go to this clinic because the [facility] was not able to help him in the past. He needed a muscle relaxer and they were not able to get it for him.  He states he will find his own MD.  NCM informed him that [facility] in [place] usually works with chronic pain patients, he states he does not want to go there either , he will find his own MD. NCM will cance this apt at the [name].              Action/Plan: NCM will follow for dc needs.   Expected Discharge Date:                  Expected Discharge Plan:  Home/Self Care  In-House Referral:     Discharge planning Services  CM Consult, Follow-up appt scheduled  Post Acute Care Choice:    Choice offered to:     DME Arranged:    DME Agency:     HH Arranged:    HH Agency:     Status of Service:  Completed, signed off  If discussed at [employer] of Stay Meetings, dates discussed:    Additional Comments:  Zenon Mayo, RN 08/22/2017, 2:23 PM
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
## 2017-08-22 NOTE — Progress Notes (Signed)
TR BAND REMOVAL  LOCATION:    right radial  DEFLATED PER PROTOCOL:    Yes.    TIME BAND OFF / DRESSING APPLIED:    1530   SITE UPON ARRIVAL:    Level 0  SITE AFTER BAND REMOVAL:    Level 0  CIRCULATION SENSATION AND MOVEMENT:    Within Normal Limits   Yes.    COMMENTS:  Post  TRB instructions given, tolerated procedure well

## 2017-08-22 NOTE — Interval H&P Note (Signed)
Cath Lab Visit (complete for each Cath Lab visit)  Clinical Evaluation Leading to the Procedure:   ACS: No.  Non-ACS:    Anginal Classification: CCS II  Anti-ischemic medical therapy: Maximal Therapy (2 or more classes of medications)  Non-Invasive Test Results: No non-invasive testing performed  Prior CABG: No previous CABG      History and Physical Interval Note:  08/22/2017 8:09 AM  John Forbes  has presented today for surgery, with the diagnosis of cad with 75% blockage of RCA  The various methods of treatment have been discussed with the patient and family. After consideration of risks, benefits and other options for treatment, the patient has consented to  Procedure(s): CORONARY STENT INTERVENTION (N/A) as a surgical intervention .  The patient's history has been reviewed, patient examined, no change in status, stable for surgery.  I have reviewed the patient's chart and labs.  Questions were answered to the patient's satisfaction.     Shelva Majestic

## 2017-08-23 DIAGNOSIS — G8929 Other chronic pain: Secondary | ICD-10-CM | POA: Diagnosis not present

## 2017-08-23 DIAGNOSIS — Z955 Presence of coronary angioplasty implant and graft: Secondary | ICD-10-CM | POA: Diagnosis not present

## 2017-08-23 DIAGNOSIS — Z9102 Food additives allergy status: Secondary | ICD-10-CM | POA: Diagnosis not present

## 2017-08-23 DIAGNOSIS — Z9049 Acquired absence of other specified parts of digestive tract: Secondary | ICD-10-CM | POA: Diagnosis not present

## 2017-08-23 DIAGNOSIS — Z91018 Allergy to other foods: Secondary | ICD-10-CM | POA: Diagnosis not present

## 2017-08-23 DIAGNOSIS — I25110 Atherosclerotic heart disease of native coronary artery with unstable angina pectoris: Secondary | ICD-10-CM

## 2017-08-23 DIAGNOSIS — I2510 Atherosclerotic heart disease of native coronary artery without angina pectoris: Secondary | ICD-10-CM | POA: Diagnosis not present

## 2017-08-23 DIAGNOSIS — M545 Low back pain: Secondary | ICD-10-CM | POA: Diagnosis not present

## 2017-08-23 DIAGNOSIS — K449 Diaphragmatic hernia without obstruction or gangrene: Secondary | ICD-10-CM | POA: Diagnosis not present

## 2017-08-23 DIAGNOSIS — E785 Hyperlipidemia, unspecified: Secondary | ICD-10-CM | POA: Diagnosis not present

## 2017-08-23 DIAGNOSIS — I472 Ventricular tachycardia: Secondary | ICD-10-CM | POA: Diagnosis not present

## 2017-08-23 DIAGNOSIS — J449 Chronic obstructive pulmonary disease, unspecified: Secondary | ICD-10-CM | POA: Diagnosis not present

## 2017-08-23 DIAGNOSIS — K219 Gastro-esophageal reflux disease without esophagitis: Secondary | ICD-10-CM | POA: Diagnosis not present

## 2017-08-23 LAB — BASIC METABOLIC PANEL
ANION GAP: 5 (ref 5–15)
BUN: 10 mg/dL (ref 6–20)
CO2: 22 mmol/L (ref 22–32)
Calcium: 7.9 mg/dL — ABNORMAL LOW (ref 8.9–10.3)
Chloride: 114 mmol/L — ABNORMAL HIGH (ref 101–111)
Creatinine, Ser: 0.72 mg/dL (ref 0.61–1.24)
GFR calc Af Amer: >60 mL/min (ref >60)
GFR calc non Af Amer: >60 mL/min (ref >60)
Glucose, Bld: 112 mg/dL — ABNORMAL HIGH (ref 65–99)
POTASSIUM: 4 mmol/L (ref 3.5–5.1)
Sodium: 141 mmol/L (ref 135–145)

## 2017-08-23 LAB — CBC
HEMATOCRIT: 36.2 % — AB (ref 39.0–52.0)
Hemoglobin: 11.9 g/dL — ABNORMAL LOW (ref 13.0–17.0)
MCH: 30.7 pg (ref 26.0–34.0)
MCHC: 32.9 g/dL (ref 30.0–36.0)
MCV: 93.3 fL (ref 78.0–100.0)
Platelets: 110 10*3/uL — ABNORMAL LOW (ref 150–400)
RBC: 3.88 MIL/uL — AB (ref 4.22–5.81)
RDW: 14.8 % (ref 11.5–15.5)
WBC: 6.1 10*3/uL (ref 4.0–10.5)

## 2017-08-23 MED ORDER — ATORVASTATIN CALCIUM 80 MG PO TABS
80.0000 mg | ORAL_TABLET | Freq: Every day | ORAL | 1 refills | Status: DC
Start: 2017-08-23 — End: 2018-02-18

## 2017-08-23 MED ORDER — HEART ATTACK BOUNCING BOOK
Freq: Once | Status: AC
  Administered 2017-08-23: 06:00:00
  Filled 2017-08-23: qty 1

## 2017-08-23 MED ORDER — METOPROLOL TARTRATE 25 MG PO TABS: 12.5000 mg | ORAL_TABLET | Freq: Two times a day (BID) | ORAL | 2 refills | Status: DC

## 2017-08-23 MED ORDER — CLOPIDOGREL BISULFATE 75 MG PO TABS: 75.0000 mg | ORAL_TABLET | Freq: Every day | ORAL | 2 refills | Status: DC

## 2017-08-23 MED ORDER — ANGIOPLASTY BOOK
Freq: Once | Status: AC
  Administered 2017-08-23: 06:00:00
  Filled 2017-08-23: qty 1

## 2017-08-23 NOTE — Progress Notes (Signed)
CARDIAC REHAB PHASE I   PRE:  Rate/Rhythm: 47 SB    BP: sitting 121/64    SaO2:   MODE:  Ambulation: 500 ft   POST:  Rate/Rhythm: 71 SR    BP: sitting 135/79     SaO2:   Tolerated well, no c/o. Pt has his own ideas about what is best for him/his health. He understands importance of Plavix/ASA. Gave information regarding smoking and congestion with smoking cessation. He is not interested. He is not very interested in diet or ex or CRPII either. Will send referral to [facility]. He understands NTG. [phone]   Darrick Meigs CES, ACSM 08/23/2017 8:51 AM

## 2017-08-23 NOTE — Discharge Summary (Signed)
Discharge Summary    Patient ID: John Forbes,  MRN: [id number], DOB/AGE: Aug 07, 1961 56 y.o.  Admit date: 08/22/2017 Discharge date: 08/23/2017  Primary Care Provider: Patient, [name] Primary Cardiologist: Claiborne Billings  Discharge Diagnoses    Active Problems:   CAD (coronary artery disease)   Allergies Allergies  Allergen Reactions  . Other Anaphylaxis and Other (See Comments)    CANNOT TOLERATE ANYTHING CONTAINING PRESERVATIVES!! Instant potatoes = Trip to the ER because his throat "closed"  . Sulfites Anaphylaxis  . Tomato Anaphylaxis    Diagnostic Studies/Procedures    LHC: 08/22/17  Conclusion     Ost RCA to Prox RCA lesion, 30 %stenosed.  RPDA lesion, 0 %stenosed.  Ost RPDA to RPDA lesion, 30 %stenosed.  Dist RCA lesion, 35 %stenosed.  A STENT RESOLUTE ONYX 3.5X22 drug eluting stent was successfully placed.  Mid RCA lesion, 80 %stenosed.  Post intervention, there is a 0% residual stenosis.   Elective PCI to a large dominant RCA that had 30% diffuse proximal stenosis, 80% complex stenosis with aneurysmal segment in the mid RCA, followed by 30% distal RCA stenosis, 30% proximal narrowing in the PDA and previously stented patent mid PDA segment.  Successful PCI with ultimate insertion of a 3.522 mm Resolute onyx DES stent postdilated to 3.65 mm with the complex eccentric stenotic and aneurysmal segment being reduced to 0% with brisk TIMI-3 flow and no evidence for dissection.  RECOMMENDATION: Continue DAPT therapy with aspirin and Plavix for minimum of a year.  With the patient's previous documentation of concomitant CAD.  We'll continue with nitrates, beta blocker therapy, and hypopotency statin therapy.  Smoking cessation is imperative.   _____________   History of Present Illness     John Forbes is a 56 y.o. male with PMH of HLD, tobacco abuse, COPD, NSVT and CAD. He initially had PCI with DES to mid PDA in 2012, however had restenosis of  PDA stent and underwent repeat PCI in 2014. He most recently was admitted in July with gallstone pancreatitis and cholecystitis. Lipase was close to 6000. Cardiology was consulted for preoperative evaluation. Echocardiogram obtained on 07/09/2017 showed EF 67-12%, grade 1 diastolic dysfunction, mild LVH. Cardiac catheterization performed on 07/11/2017 showed severe single vessel CAD involving 75% mid RCA, continued patency of stented segment in right PDA with moderate focal proximal edge restenosis, mild-to-moderate left circumflex/first OM stenosis, mild nonobstructive LAD stenosis with severe stenosis in tiny diagonal branch. Given lack of symptom, he was cleared to proceed with gallstone surgery and consider PCI at a later time. He eventually underwent laparoscopic cholecystectomy on 07/12/2017.  Patient presented on 8/17 for cardiology office visit, he had been doing well since his discharge. His cholecystectomy wound was healing very well without any drainage or sign of infection. He denied any chest discomfort, but he did have a prolonged chest tightness. He said this was likely related to his COPD. He continued to smoke at this time and was not interested in quitting smoking. In fact he said every time he tried to quit smoking, his lung become more congested. Case was discussed with Dr. Claiborne Billings and planned for outpatient cath.   Hospital Course     He underwent cardiac cath with Dr. Claiborne Billings noted above with PCI/DES to [facility] with TIMI-3 flow. Plan for DAPT with ASA/plavix for at least one year. Post cath labs showed Cr 0.72 and Hgb 11.2. No complications noted post cath. High dose statin was added this admission. Bradycardic, therefore unable  to titrate up BB at this time. Worked with cardiac rehab without any difficulty. Discussed the need for smoking cessation but patient reports he has no plans to quit at this time, and feels that it makes his lungs better.   General: Well developed, well nourished, male  appearing in no acute distress. Head: Normocephalic, atraumatic.  Neck: Supple without bruits, JVD. Lungs:  Resp regular and unlabored, CTA. Heart: RRR, S1, S2, no S3, S4, or murmur; no rub. Abdomen: Soft, non-tender, non-distended with normoactive bowel sounds. No hepatomegaly. No rebound/guarding. No obvious abdominal masses. Extremities: No clubbing, cyanosis, edema. Distal pedal pulses are 2+ bilaterally. R radial cath site stable without bruising or hematoma Neuro: Alert and oriented X 3. Moves all extremities spontaneously. Psych: Normal affect.  John Forbes was seen by Dr. Tamala Julian and determined stable for discharge home. Follow up in the office has been arranged. Medications are listed below.   _____________  Discharge Vitals Blood pressure 121/64, pulse (!) 46, temperature 97.8 F (36.6 C), temperature source Oral, resp. rate 17, height 6' (1.829 m), weight 206 lb (93.4 kg), SpO2 98 %.  Filed Weights   08/22/17 0607 08/23/17 0511  Weight: 206 lb (93.4 kg) 206 lb (93.4 kg)    Labs & Radiologic Studies    CBC  Recent Labs  08/23/17 0142  WBC 6.1  HGB 11.9*  HCT 36.2*  MCV 93.3  PLT 948*   Basic Metabolic Panel  Recent Labs  08/23/17 0142  NA 141  K 4.0  CL 114*  CO2 22  GLUCOSE 112*  BUN 10  CREATININE 0.72  CALCIUM 7.9*   Liver Function Tests No results for input(s): AST, ALT, ALKPHOS, BILITOT, PROT, ALBUMIN in the last 72 hours. No results for input(s): LIPASE, AMYLASE in the last 72 hours. Cardiac Enzymes No results for input(s): CKTOTAL, CKMB, CKMBINDEX, TROPONINI in the last 72 hours. BNP Invalid input(s): POCBNP D-Dimer No results for input(s): DDIMER in the last 72 hours. Hemoglobin A1C No results for input(s): HGBA1C in the last 72 hours. Fasting Lipid Panel No results for input(s): CHOL, HDL, LDLCALC, TRIG, CHOLHDL, LDLDIRECT in the last 72 hours. Thyroid Function Tests No results for input(s): TSH, T4TOTAL, T3FREE, THYROIDAB in the  last 72 hours.  Invalid input(s): FREET3 _____________  No results found. Disposition   Pt is being discharged home today in good condition.  Follow-up Plans & Appointments    Follow-up [facility] Follow up on 09/12/2017.   Why:  1:30 for hospital follow up Contact information: [address] [id number] [place]       [place], [place] Follow up on 09/11/2017.   Specialties:  Cardiology, Radiology Why:  at 2:30pm for your follow up appt.  Contact information: [address] [place] [address] [phone]            Discharge Medications     Medication List    STOP taking these medications   docusate sodium 100 MG capsule Commonly known as:  COLACE   oxyCODONE 5 MG immediate release tablet Commonly known as:  Oxy IR/ROXICODONE     TAKE these medications   albuterol (2.5 MG/3ML) 0.083% nebulizer solution Commonly known as:  PROVENTIL Take 3 mLs (2.5 mg total) by nebulization every 6 (six) hours as needed for wheezing.   aspirin 81 MG chewable tablet Chew 81 mg by mouth daily.   atorvastatin 80 MG tablet Commonly known  as:  LIPITOR Take 1 tablet (80 mg total) by mouth daily at 6 PM.   clopidogrel 75 MG tablet Commonly known as:  PLAVIX Take 1 tablet (75 mg total) by mouth daily with breakfast.   diphenhydrAMINE 25 mg capsule Commonly known as:  BENADRYL Take 25 mg by mouth every 6 (six) hours as needed for allergies.   isosorbide mononitrate 30 MG 24 hr tablet Commonly known as:  IMDUR Take 1 tablet (30 mg total) by mouth daily.   metoprolol tartrate 25 MG tablet Commonly known as:  LOPRESSOR Take 0.5 tablets (12.5 mg total) by mouth 2 (two) times daily. What changed:  how much to take   nitroGLYCERIN 0.4 MG SL tablet Commonly known as:  NITROSTAT Place 1 tablet (0.4 mg total) under the tongue every 5 (five) minutes as needed for chest pain.     ranitidine 150 MG tablet Commonly known as:  ZANTAC Take 1 tablet (150 mg total) by mouth 2 (two) times daily. What changed:  when to take this  reasons to take this        Aspirin prescribed at discharge?  Yes High Intensity Statin Prescribed? (Lipitor 40-80mg  or Crestor 20-40mg ): Yes Beta Blocker Prescribed? Yes For EF <40%, was ACEI/ARB Prescribed? No: EF ok ADP Receptor Inhibitor Prescribed? (i.e. Plavix etc.-Includes Medically Managed Patients): Yes For EF <40%, Aldosterone Inhibitor Prescribed? No: EF ok Was EF assessed during THIS hospitalization? Yes Was Cardiac Rehab II ordered? (Included Medically managed Patients): Yes   Outstanding Labs/Studies   FLP/LFTs in 6 weeks if tolerating statin.   Duration of Discharge Encounter   Greater than 30 minutes including physician time.  Signed, Reino Bellis NP-C 08/23/2017, 8:11 AM The patient has been seen in conjunction with Reino Bellis, NP. All aspects of care have been considered and discussed. The patient has been personally interviewed, examined, and all clinical data has been reviewed.   Status post PCI and stenting of the right coronary. Digital images personally reviewed and an excellent result was obtained.  Access site is unremarkable.  Ready for discharge with follow-up as outlined in this note.

## 2017-08-30 ENCOUNTER — Ambulatory Visit (HOSPITAL_COMMUNITY)
Admission: RE | Admit: 2017-08-30 | Payer: Medicare Other | Source: Ambulatory Visit | Attending: Physician Assistant | Admitting: Physician Assistant

## 2017-08-30 ENCOUNTER — Ambulatory Visit (HOSPITAL_COMMUNITY)
Admission: RE | Admit: 2017-08-30 | Discharge: 2017-08-30 | Disposition: A | Discharge status: Home / Self Care | Payer: Medicare Other | Source: Ambulatory Visit | Attending: Cardiology | Admitting: Cardiology

## 2017-08-30 DIAGNOSIS — R0989 Other specified symptoms and signs involving the circulatory and respiratory systems: Secondary | ICD-10-CM | POA: Diagnosis not present

## 2017-08-30 DIAGNOSIS — M79606 Pain in leg, unspecified: Secondary | ICD-10-CM | POA: Diagnosis not present

## 2017-09-11 ENCOUNTER — Encounter: Payer: Self-pay | Admitting: Physician Assistant

## 2017-09-11 ENCOUNTER — Ambulatory Visit (INDEPENDENT_AMBULATORY_CARE_PROVIDER_SITE_OTHER): Payer: Medicare Other | Admitting: Physician Assistant

## 2017-09-11 VITALS — BP 90/60 | HR 68 | Ht 71.0 in | Wt 208.0 lb

## 2017-09-11 DIAGNOSIS — J449 Chronic obstructive pulmonary disease, unspecified: Secondary | ICD-10-CM | POA: Diagnosis not present

## 2017-09-11 DIAGNOSIS — I952 Hypotension due to drugs: Secondary | ICD-10-CM

## 2017-09-11 DIAGNOSIS — E785 Hyperlipidemia, unspecified: Secondary | ICD-10-CM

## 2017-09-11 DIAGNOSIS — Z79899 Other long term (current) drug therapy: Secondary | ICD-10-CM | POA: Diagnosis not present

## 2017-09-11 DIAGNOSIS — I2510 Atherosclerotic heart disease of native coronary artery without angina pectoris: Secondary | ICD-10-CM | POA: Diagnosis not present

## 2017-09-11 NOTE — Patient Instructions (Signed)
Medication Instructions:  STOP-Isosorbide  If you need a refill on your cardiac medications before your next appointment, please call your pharmacy.  Labwork: Fasting Lipids Liver in 6 weeks HERE IN OUR OFFICE AT [facility]  Testing/Procedures: None Order  Follow-Up: Your physician wants you to follow-up in: 3-4 Months with Dr Radford Pax.    Thank you for choosing [facility] at [facility]!!
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
## 2017-09-12 ENCOUNTER — Ambulatory Visit: Payer: Medicare Other | Admitting: Family Medicine

## 2017-09-12 ENCOUNTER — Encounter: Payer: Self-pay | Admitting: Physician Assistant

## 2017-10-02 DIAGNOSIS — M792 Neuralgia and neuritis, unspecified: Secondary | ICD-10-CM | POA: Diagnosis not present

## 2017-10-18 ENCOUNTER — Emergency Department (HOSPITAL_COMMUNITY): Payer: Medicare Other

## 2017-10-18 ENCOUNTER — Encounter (HOSPITAL_COMMUNITY): Payer: Self-pay | Admitting: Emergency Medicine

## 2017-10-18 ENCOUNTER — Emergency Department (HOSPITAL_COMMUNITY)
Admission: EM | Admit: 2017-10-18 | Discharge: 2017-10-18 | Disposition: A | Discharge status: Home / Self Care | Payer: Medicare Other | Attending: Emergency Medicine | Admitting: Emergency Medicine

## 2017-10-18 DIAGNOSIS — S01312A Laceration without foreign body of left ear, initial encounter: Secondary | ICD-10-CM | POA: Insufficient documentation

## 2017-10-18 DIAGNOSIS — Z23 Encounter for immunization: Secondary | ICD-10-CM | POA: Insufficient documentation

## 2017-10-18 DIAGNOSIS — S0081XA Abrasion of other part of head, initial encounter: Secondary | ICD-10-CM | POA: Diagnosis not present

## 2017-10-18 DIAGNOSIS — Y939 Activity, unspecified: Secondary | ICD-10-CM | POA: Insufficient documentation

## 2017-10-18 DIAGNOSIS — J9811 Atelectasis: Secondary | ICD-10-CM | POA: Diagnosis not present

## 2017-10-18 DIAGNOSIS — T07XXXA Unspecified multiple injuries, initial encounter: Secondary | ICD-10-CM

## 2017-10-18 DIAGNOSIS — S0181XA Laceration without foreign body of other part of head, initial encounter: Secondary | ICD-10-CM | POA: Insufficient documentation

## 2017-10-18 DIAGNOSIS — Y999 Unspecified external cause status: Secondary | ICD-10-CM | POA: Insufficient documentation

## 2017-10-18 DIAGNOSIS — F17210 Nicotine dependence, cigarettes, uncomplicated: Secondary | ICD-10-CM | POA: Insufficient documentation

## 2017-10-18 DIAGNOSIS — S2193XA Puncture wound without foreign body of unspecified part of thorax, initial encounter: Secondary | ICD-10-CM

## 2017-10-18 DIAGNOSIS — Y92009 Unspecified place in unspecified non-institutional (private) residence as the place of occurrence of the external cause: Secondary | ICD-10-CM | POA: Insufficient documentation

## 2017-10-18 DIAGNOSIS — S21112A Laceration without foreign body of left front wall of thorax without penetration into thoracic cavity, initial encounter: Secondary | ICD-10-CM | POA: Insufficient documentation

## 2017-10-18 DIAGNOSIS — I2510 Atherosclerotic heart disease of native coronary artery without angina pectoris: Secondary | ICD-10-CM | POA: Diagnosis not present

## 2017-10-18 DIAGNOSIS — S0990XA Unspecified injury of head, initial encounter: Secondary | ICD-10-CM | POA: Diagnosis not present

## 2017-10-18 DIAGNOSIS — J449 Chronic obstructive pulmonary disease, unspecified: Secondary | ICD-10-CM | POA: Diagnosis not present

## 2017-10-18 DIAGNOSIS — S21139A Puncture wound without foreign body of unspecified front wall of thorax without penetration into thoracic cavity, initial encounter: Secondary | ICD-10-CM

## 2017-10-18 HISTORY — DX: Nicotine dependence, unspecified, uncomplicated: F17.200

## 2017-10-18 LAB — BPAM FFP
BLOOD PRODUCT EXPIRATION DATE: 201811022359
BLOOD PRODUCT EXPIRATION DATE: 201811042359
ISSUE DATE / TIME: 201811011646
ISSUE DATE / TIME: 201811011646
UNIT TYPE AND RH: 6200
Unit Type and Rh: 600

## 2017-10-18 LAB — I-STAT CHEM 8, ED
BUN: 12 mg/dL (ref 6–20)
CREATININE: 1 mg/dL (ref 0.61–1.24)
Calcium, Ion: 1.1 mmol/L — ABNORMAL LOW (ref 1.15–1.40)
Chloride: 107 mmol/L (ref 101–111)
GLUCOSE: 94 mg/dL (ref 65–99)
HCT: 44 % (ref 39.0–52.0)
HEMOGLOBIN: 15 g/dL (ref 13.0–17.0)
POTASSIUM: 4.1 mmol/L (ref 3.5–5.1)
Sodium: 142 mmol/L (ref 135–145)
TCO2: 24 mmol/L (ref 22–32)

## 2017-10-18 LAB — I-STAT CG4 LACTIC ACID, ED: Lactic Acid, Venous: 2.39 mmol/L (ref 0.5–1.9)

## 2017-10-18 LAB — PREPARE FRESH FROZEN PLASMA
UNIT DIVISION: 0
Unit division: 0

## 2017-10-18 LAB — ABO/RH: ABO/RH(D): O POS

## 2017-10-18 MED ORDER — CEFAZOLIN SODIUM-DEXTROSE 2-4 GM/100ML-% IV SOLN
INTRAVENOUS | Status: AC
  Filled 2017-10-18: qty 100

## 2017-10-18 MED ORDER — TETANUS-DIPHTH-ACELL PERTUSSIS 5-2.5-18.5 LF-MCG/0.5 IM SUSP
INTRAMUSCULAR | Status: AC
  Administered 2017-10-18: 0.5 mL via INTRAMUSCULAR
  Filled 2017-10-18: qty 0.5

## 2017-10-18 MED ORDER — TETANUS-DIPHTH-ACELL PERTUSSIS 5-2.5-18.5 LF-MCG/0.5 IM SUSP
0.5000 mL | Freq: Once | INTRAMUSCULAR | Status: AC
  Administered 2017-10-18: 0.5 mL via INTRAMUSCULAR
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
## 2017-10-18 NOTE — Progress Notes (Signed)
Orthopedic Tech Progress Note Patient Details:  John Forbes 12/18/1875 [id number]  Ortho Devices Type of Ortho Device: Other (comment) Ortho Device/Splint Location: present at level 1 trauma   Kristopher Oppenheim 10/18/2017, 5:05 PM

## 2017-10-18 NOTE — ED Notes (Signed)
Wound care being performed by Jeanell Sparrow, MD at this time
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
## 2017-10-19 ENCOUNTER — Encounter: Payer: Self-pay | Admitting: Physician Assistant

## 2017-10-19 LAB — BPAM RBC
Blood Product Expiration Date: 201811072359
Blood Product Expiration Date: 201811142359
ISSUE DATE / TIME: 201811011645
ISSUE DATE / TIME: 201811011645
UNIT TYPE AND RH: 9500
Unit Type and Rh: 9500

## 2017-10-19 LAB — TYPE AND SCREEN
ABO/RH(D): O POS
ANTIBODY SCREEN: NEGATIVE
UNIT DIVISION: 0
Unit division: 0

## 2018-02-18 ENCOUNTER — Other Ambulatory Visit: Payer: Self-pay | Admitting: Cardiology

## 2018-05-17 DIAGNOSIS — M792 Neuralgia and neuritis, unspecified: Secondary | ICD-10-CM | POA: Diagnosis not present

## 2018-05-30 ENCOUNTER — Telehealth: Payer: Self-pay | Admitting: Physician Assistant

## 2018-05-30 NOTE — Telephone Encounter (Signed)
Received notes from [facility] on 05/30/18, Appt 06/27/18 @ 2:30PM. [place]

## 2018-06-27 ENCOUNTER — Ambulatory Visit: Payer: Medicare Other | Admitting: Physician Assistant

## 2018-07-01 ENCOUNTER — Encounter: Payer: Self-pay | Admitting: *Deleted

## 2018-07-23 DIAGNOSIS — Z0000 Encounter for general adult medical examination without abnormal findings: Secondary | ICD-10-CM | POA: Diagnosis not present

## 2018-07-23 DIAGNOSIS — F17290 Nicotine dependence, other tobacco product, uncomplicated: Secondary | ICD-10-CM | POA: Diagnosis not present

## 2018-08-22 ENCOUNTER — Other Ambulatory Visit: Payer: Self-pay | Admitting: Cardiology

## 2018-08-22 MED ORDER — ATORVASTATIN CALCIUM 80 MG PO TABS: ORAL_TABLET | ORAL | 1 refills | Status: AC

## 2018-10-17 ENCOUNTER — Other Ambulatory Visit: Payer: Self-pay | Admitting: Cardiology

## 2018-11-18 ENCOUNTER — Ambulatory Visit (INDEPENDENT_AMBULATORY_CARE_PROVIDER_SITE_OTHER): Payer: Medicare HMO | Admitting: Family Medicine

## 2018-11-18 ENCOUNTER — Encounter: Payer: Self-pay | Admitting: Family Medicine

## 2018-11-18 VITALS — BP 96/60 | HR 82 | Temp 98.0°F | Ht 71.0 in | Wt 189.0 lb

## 2018-11-18 DIAGNOSIS — I639 Cerebral infarction, unspecified: Secondary | ICD-10-CM

## 2018-11-18 DIAGNOSIS — J302 Other seasonal allergic rhinitis: Secondary | ICD-10-CM | POA: Diagnosis not present

## 2018-11-18 DIAGNOSIS — J449 Chronic obstructive pulmonary disease, unspecified: Secondary | ICD-10-CM | POA: Diagnosis not present

## 2018-11-18 DIAGNOSIS — I10 Essential (primary) hypertension: Secondary | ICD-10-CM | POA: Diagnosis not present

## 2018-11-18 DIAGNOSIS — K219 Gastro-esophageal reflux disease without esophagitis: Secondary | ICD-10-CM | POA: Diagnosis not present

## 2018-11-18 DIAGNOSIS — H7292 Unspecified perforation of tympanic membrane, left ear: Secondary | ICD-10-CM

## 2018-11-18 DIAGNOSIS — Z7689 Persons encountering health services in other specified circumstances: Secondary | ICD-10-CM

## 2018-11-18 DIAGNOSIS — Z8679 Personal history of other diseases of the circulatory system: Secondary | ICD-10-CM | POA: Diagnosis not present

## 2018-11-18 DIAGNOSIS — E785 Hyperlipidemia, unspecified: Secondary | ICD-10-CM

## 2018-11-18 DIAGNOSIS — I519 Heart disease, unspecified: Secondary | ICD-10-CM | POA: Diagnosis not present

## 2018-11-18 DIAGNOSIS — M1990 Unspecified osteoarthritis, unspecified site: Secondary | ICD-10-CM
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
## 2018-11-18 NOTE — Patient Instructions (Signed)
Chronic Obstructive Pulmonary Disease Exacerbation  Chronic obstructive pulmonary disease (COPD) is a common lung problem. In COPD, the flow of air from the lungs is limited. COPD exacerbations are times that breathing gets worse and you need extra treatment. Without treatment they can be life threatening. If they happen often, your lungs can become more damaged. If your COPD gets worse, your doctor may treat you with:  ? Medicines.  ? Oxygen.  ? Different ways to clear your airway, such as using a mask.    Follow these instructions at home:  ? Do not smoke.  ? Avoid tobacco smoke and other things that bother your lungs.  ? If given, take your antibiotic medicine as told. Finish the medicine even if you start to feel better.  ? Only take medicines as told by your doctor.  ? Drink enough fluids to keep your pee (urine) clear or pale yellow (unless your doctor has told you not to).  ? Use a cool mist machine (vaporizer).  ? If you use oxygen or a machine that turns liquid medicine into a mist (nebulizer), continue to use them as told.  ? Keep up with shots (vaccinations) as told by your doctor.  ? Exercise regularly.  ? Eat healthy foods.  ? Keep all doctor visits as told.  Get help right away if:  ? You are very short of breath and it gets worse.  ? You have trouble talking.  ? You have bad chest pain.  ? You have blood in your spit (sputum).  ? You have a fever.  ? You keep throwing up (vomiting).  ? You feel weak, or you pass out (faint).  ? You feel confused.  ? You keep getting worse.  This information is not intended to replace advice given to you by your health care provider. Make sure you discuss any questions you have with your health care provider.  Document Released: 11/23/2011 Document Revised: 05/11/2016 Document Reviewed: 08/08/2013  [employer]vier Interactive Patient Education ? 2017 [employer]

## 2018-11-19 ENCOUNTER — Encounter: Payer: Self-pay | Admitting: Family Medicine

## 2018-12-02 ENCOUNTER — Ambulatory Visit: Payer: Medicare HMO | Admitting: Family Medicine

## 2018-12-04 ENCOUNTER — Ambulatory Visit: Payer: Medicare HMO | Admitting: Family Medicine

## 2018-12-04 DIAGNOSIS — Z0289 Encounter for other administrative examinations: Secondary | ICD-10-CM

## 2019-01-07 ENCOUNTER — Encounter: Payer: Self-pay | Admitting: Family Medicine

## 2019-06-23 ENCOUNTER — Ambulatory Visit: Payer: Medicare HMO | Admitting: Family Medicine
